# Patient Record
Sex: Male | Born: 1985 | Race: Black or African American | Hispanic: No | Marital: Single | Smoking: Current every day smoker
Health system: Southern US, Community
[De-identification: ages and names within clinical notes are randomized; demographics above are authoritative.]

## PROBLEM LIST (undated history)

## (undated) DIAGNOSIS — Z789 Other specified health status: Secondary | ICD-10-CM

## (undated) HISTORY — DX: Other specified health status: Z78.9

## (undated) HISTORY — PX: APPENDECTOMY: SHX54

## (undated) HISTORY — PX: CIRCUMCISION: SUR203

---

## 2009-12-28 ENCOUNTER — Emergency Department (HOSPITAL_COMMUNITY): Admission: EM | Admit: 2009-12-28 | Discharge: 2009-12-28 | Payer: Self-pay | Admitting: Emergency Medicine

## 2011-07-31 IMAGING — CR DG FINGER LITTLE 2+V*R*
3 series · 3 of 3 positions shown · non-contrast
Comparison: None.

CLINICAL DATA: Laceration of the fifth digit

RIGHT LITTLE FINGER 2+V

[x finger pa right]
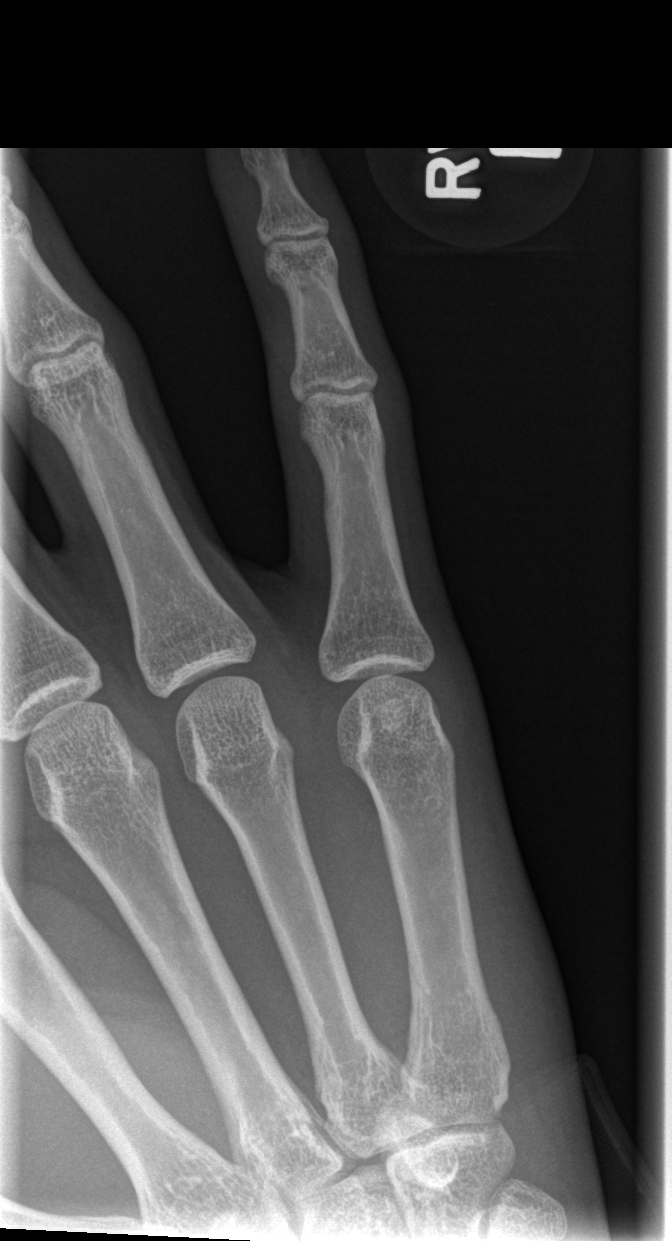

[x finger obl. right]
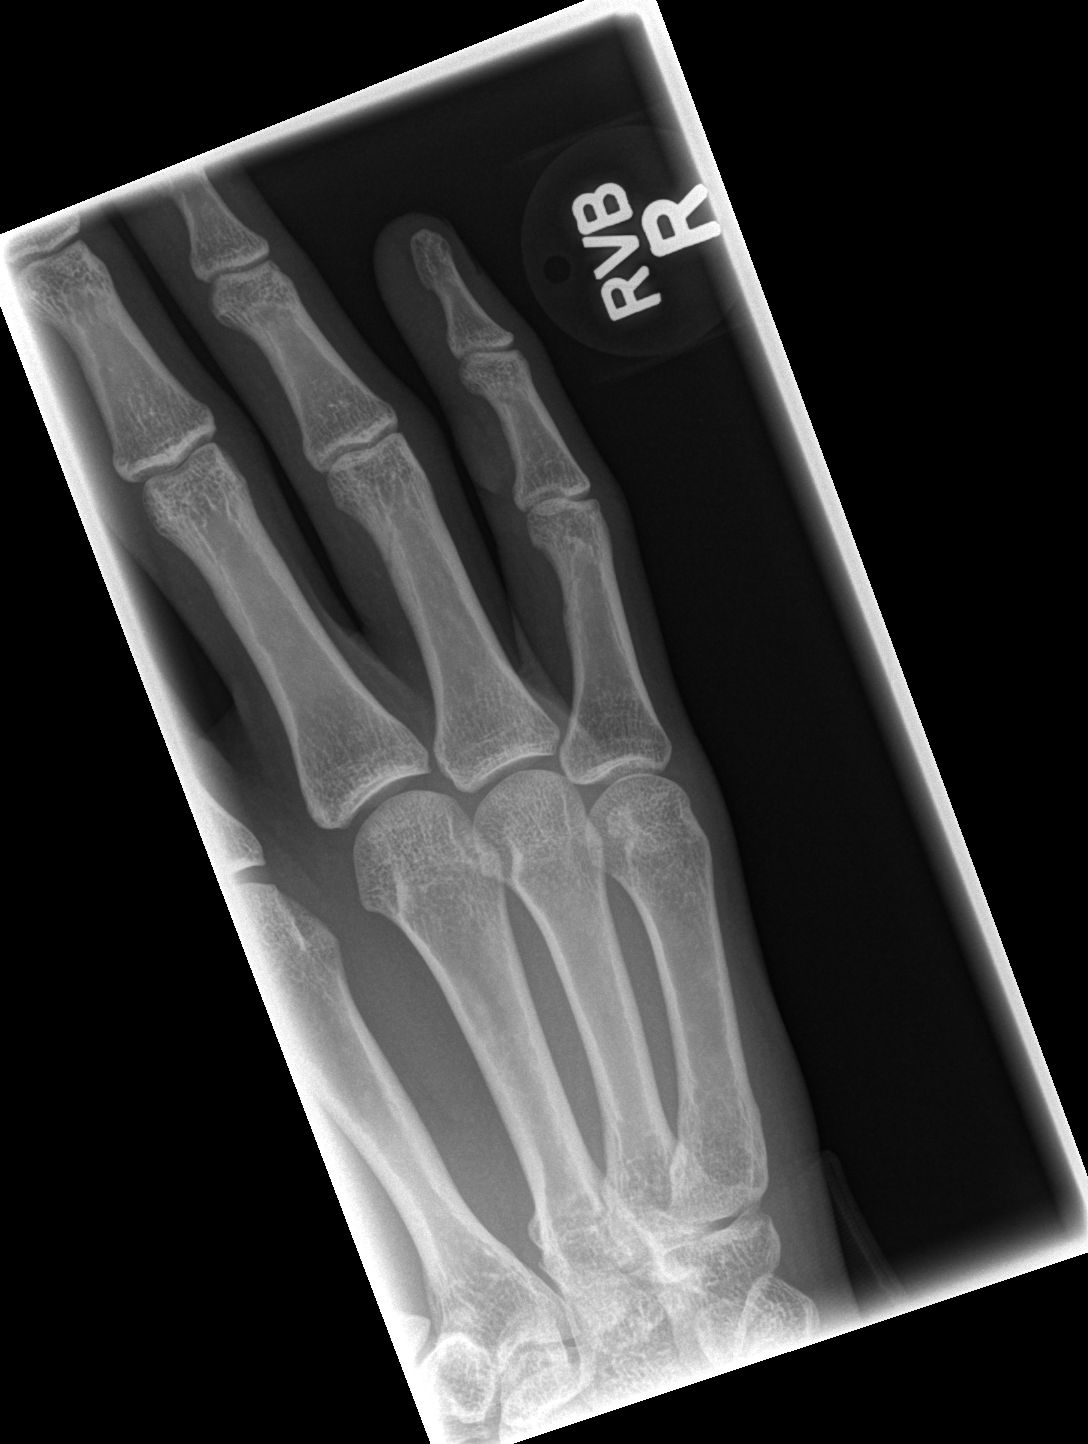

[x finger lateral right]
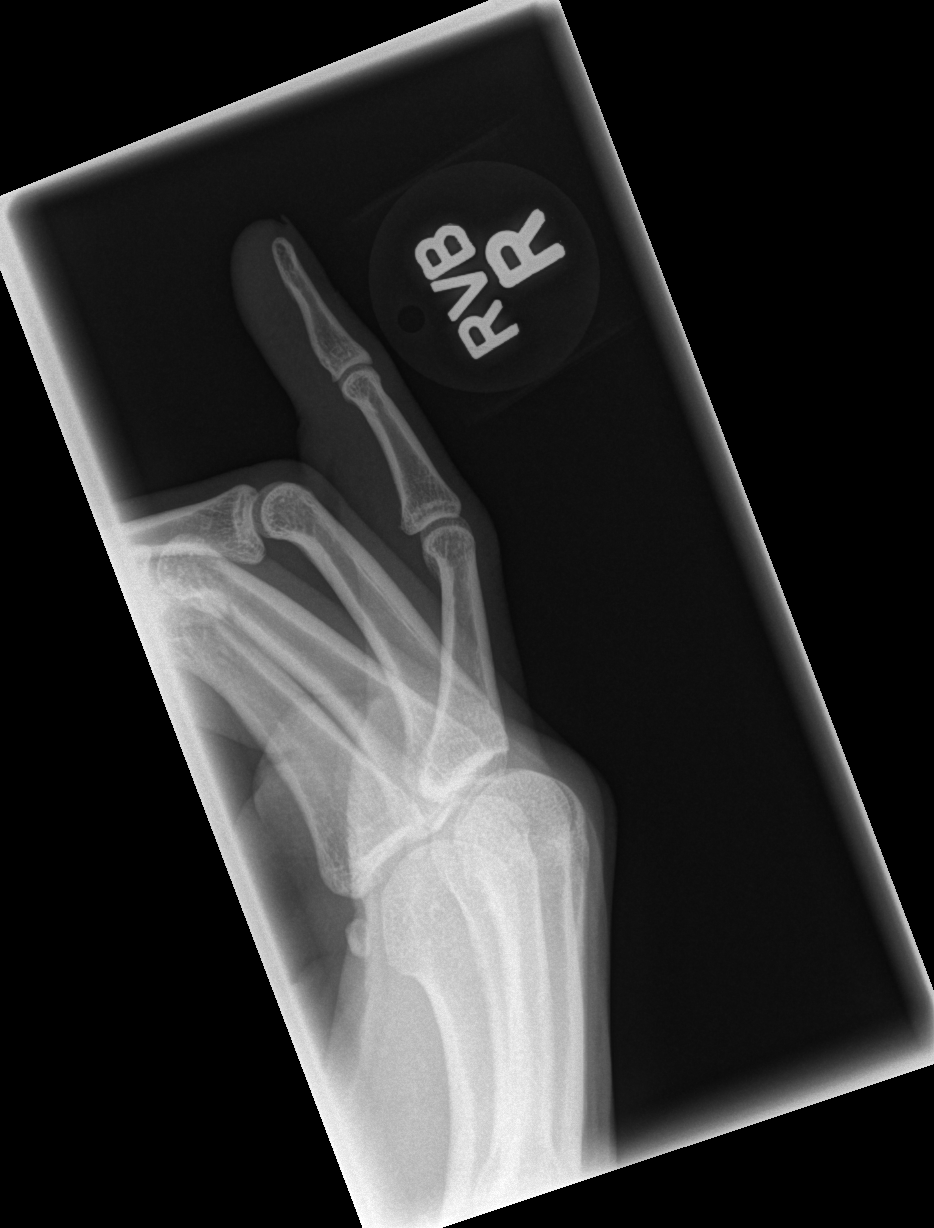

[3 of 3 positions shown; findings below may reference images not displayed]

FINDINGS: No acute fracture is seen.  Alignment is normal.  Joint
spaces appear normal.
IMPRESSION: Negative right fifth digit.

## 2018-05-15 DIAGNOSIS — R1115 Cyclical vomiting syndrome unrelated to migraine: Secondary | ICD-10-CM | POA: Insufficient documentation

## 2022-06-18 ENCOUNTER — Other Ambulatory Visit: Payer: Self-pay

## 2022-06-18 ENCOUNTER — Emergency Department (HOSPITAL_COMMUNITY)
Admission: EM | Admit: 2022-06-18 | Discharge: 2022-06-19 | Disposition: A | Discharge status: Home / Self Care | Payer: Medicaid Other | Attending: Emergency Medicine | Admitting: Emergency Medicine

## 2022-06-18 ENCOUNTER — Encounter (HOSPITAL_COMMUNITY): Payer: Self-pay

## 2022-06-18 DIAGNOSIS — E876 Hypokalemia: Secondary | ICD-10-CM | POA: Insufficient documentation

## 2022-06-18 DIAGNOSIS — R112 Nausea with vomiting, unspecified: Secondary | ICD-10-CM

## 2022-06-18 DIAGNOSIS — R109 Unspecified abdominal pain: Secondary | ICD-10-CM | POA: Insufficient documentation

## 2022-06-18 LAB — CBG MONITORING, ED: Glucose-Capillary: 126 mg/dL — ABNORMAL HIGH (ref 70–99)

## 2022-06-18 NOTE — ED Triage Notes (Signed)
Pt. BIB [facility] for vomiting x8 hrs. 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 Pt. Also states that his prevents him from laying flat. Pt. Is in the bed in a knee to chest position.

## 2022-06-19 LAB — URINALYSIS, ROUTINE W REFLEX MICROSCOPIC
Bacteria, UA: NONE SEEN
Bilirubin Urine: NEGATIVE
Glucose, UA: NEGATIVE mg/dL
Hgb urine dipstick: NEGATIVE
Ketones, ur: 5 mg/dL — AB
Leukocytes,Ua: NEGATIVE
Nitrite: NEGATIVE
Protein, ur: 30 mg/dL — AB
Specific Gravity, Urine: 1.026 (ref 1.005–1.030)
pH: 8 (ref 5.0–8.0)

## 2022-06-19 LAB — COMPREHENSIVE METABOLIC PANEL
ALT: 19 U/L (ref 0–44)
AST: 25 U/L (ref 15–41)
Albumin: 4.2 g/dL (ref 3.5–5.0)
Alkaline Phosphatase: 45 U/L (ref 38–126)
Anion gap: 12 (ref 5–15)
BUN: 10 mg/dL (ref 6–20)
CO2: 20 mmol/L — ABNORMAL LOW (ref 22–32)
Calcium: 9.4 mg/dL (ref 8.9–10.3)
Chloride: 106 mmol/L (ref 98–111)
Creatinine, Ser: 0.99 mg/dL (ref 0.61–1.24)
GFR, Estimated: >60 mL/min (ref >60)
Glucose, Bld: 110 mg/dL — ABNORMAL HIGH (ref 70–99)
Potassium: 3.3 mmol/L — ABNORMAL LOW (ref 3.5–5.1)
Sodium: 138 mmol/L (ref 135–145)
Total Bilirubin: 1.1 mg/dL (ref 0.3–1.2)
Total Protein: 8.2 g/dL — ABNORMAL HIGH (ref 6.5–8.1)

## 2022-06-19 LAB — CBC
HCT: 41.7 % (ref 39.0–52.0)
Hemoglobin: 14.5 g/dL (ref 13.0–17.0)
MCH: 31.6 pg (ref 26.0–34.0)
MCHC: 34.8 g/dL (ref 30.0–36.0)
MCV: 90.8 fL (ref 80.0–100.0)
Platelets: 246 10*3/uL (ref 150–400)
RBC: 4.59 MIL/uL (ref 4.22–5.81)
RDW: 12.3 % (ref 11.5–15.5)
WBC: 5.3 10*3/uL (ref 4.0–10.5)
nRBC: 0 % (ref 0.0–0.2)

## 2022-06-19 LAB — LIPASE, BLOOD: Lipase: 32 U/L (ref 11–51)

## 2022-06-19 MED ORDER — SODIUM CHLORIDE 0.9 % IV SOLN
25.0000 mg | INTRAVENOUS | Status: AC
  Administered 2022-06-19: 25 mg via INTRAVENOUS
  Filled 2022-06-19: qty 25

## 2022-06-19 MED ORDER — LACTATED RINGERS IV BOLUS
1000.0000 mL | Freq: Once | INTRAVENOUS | Status: AC
  Administered 2022-06-19: 1000 mL via INTRAVENOUS

## 2022-06-19 MED ORDER — CAPSAICIN 0.075 % EX CREA
TOPICAL_CREAM | CUTANEOUS | Status: AC
  Filled 2022-06-19: qty 60

## 2022-06-19 MED ORDER — PROMETHAZINE HCL 25 MG PO TABS: 25.0000 mg | ORAL_TABLET | Freq: Four times a day (QID) | ORAL | 0 refills | Status: DC | PRN

## 2022-06-19 MED ORDER — DROPERIDOL 2.5 MG/ML IJ SOLN
1.2500 mg | Freq: Once | INTRAMUSCULAR | Status: AC
  Administered 2022-06-19: 1.25 mg via INTRAVENOUS
  Filled 2022-06-19: qty 2

## 2022-06-19 NOTE — ED Provider Notes (Signed)
[place] EMERGENCY DEPARTMENT AT [facility] Provider Note   CSN: [id number] Arrival date & time: 06/18/22  2310     History  No chief complaint on file.   Reginald Choi is a 37 y.o. male.  HPI  Patient is a 37 year old  Presents emergency room today with complaints of nausea and vomiting.  States that he has been vomiting nearly constantly for the past 8 hours.  States a few hours ago he started having abdominal pain that primarily hurts when he vomits.  Denies any chest pain difficulty breathing.  No lightheadedness or dizziness.  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  He declines to indicate when he last used marijuana.  Denies any recreational drug use other than marijuana.  Denies any alcohol use.  States he usually goes to other hospitals. States he has not had a flareup in "a while "  No bloody or bilious emesis.    Home Medications Prior to Admission medications   Medication Sig Start Date End Date Taking? Authorizing Provider  promethazine (PHENERGAN) 25 MG tablet Take 1 tablet (25 mg total) by mouth every 6 (six) hours as needed for nausea or vomiting. 06/19/22  Yes Tedd Sias, PA      Allergies    Patient has no known allergies.    Review of Systems   Review of Systems  Physical Exam Updated Vital Signs BP 126/72   Pulse (!) 56   Temp 98.6 F (37 C)   Resp 16   SpO2 98%  Physical Exam Vitals and nursing note reviewed.  Constitutional:      General: He is not in acute distress. HENT:     Head: Normocephalic and atraumatic.     Nose: Nose normal.     Mouth/Throat:     Mouth: Mucous membranes are dry.  Eyes:     General: No scleral icterus. Cardiovascular:     Rate and Rhythm: Normal rate and regular rhythm.     Pulses: Normal pulses.     Heart sounds: Normal heart sounds.  Pulmonary:     Effort: Pulmonary effort is normal. No respiratory distress.     Breath sounds: No  wheezing.     Comments: Lungs clear Abdominal:     Palpations: Abdomen is soft.     Tenderness: There is abdominal tenderness.  Musculoskeletal:     Cervical back: Normal range of motion.     Right lower leg: No edema.     Left lower leg: No edema.  Skin:    General: Skin is warm and dry.     Capillary Refill: Capillary refill takes less than 2 seconds.  Neurological:     Mental Status: He is alert. Mental status is at baseline.  Psychiatric:        Mood and Affect: Mood normal.        Behavior: Behavior normal.     ED Results / Procedures / Treatments   Labs (all labs ordered are listed, but only abnormal results are displayed) Labs Reviewed  COMPREHENSIVE METABOLIC PANEL - Abnormal; Notable for the following components:      Result Value   Potassium 3.3 (*)    CO2 20 (*)    Glucose, Bld 110 (*)    Total Protein 8.2 (*)    All other components within normal limits  URINALYSIS, ROUTINE W REFLEX MICROSCOPIC - Abnormal; Notable for the following components:   Ketones, ur 5 (*)  Protein, ur 30 (*)    All other components within normal limits  CBG MONITORING, ED - Abnormal; Notable for the following components:   Glucose-Capillary 126 (*)    All other components within normal limits  LIPASE, BLOOD  CBC    EKG EKG Interpretation  Date/Time:  Saturday June 19 2022 02:47:37 EST Ventricular Rate:  67 PR Interval:  144 QRS Duration: 78 QT Interval:  425 QTC Calculation: 449 R Axis:   80 Text Interpretation: Sinus rhythm Confirmed by Quintella Reichert [phone]) on 06/19/2022 4:27:30 AM  Radiology No results found.  Procedures Procedures    Medications Ordered in ED Medications  lactated ringers bolus 1,000 mL (0 mLs Intravenous Stopped 06/19/22 0211)  promethazine (PHENERGAN) 25 mg in sodium chloride 0.9 % 50 mL IVPB (0 mg Intravenous Stopped 06/19/22 0055)  capsicum (ZOSTRIX) 0.075 % cream ( Topical Given 06/19/22 0042)  lactated ringers bolus 1,000 mL (0 mLs  Intravenous Stopped 06/19/22 0415)  droperidol (INAPSINE) 2.5 MG/ML injection 1.25 mg (1.25 mg Intravenous Given 06/19/22 0412)    ED Course/ Medical Decision Making/ A&P Clinical Course as of 06/19/22 2220  Sat Jun 19, 2022  0015 Yesterday evening and today NV no change in stool. MJ smokes regularly. No blood or bile in emesis.  Abd pain began after vomiting. No urinary sx.  No CP or SOB.  [WF]    Clinical Course User Index [WF] Tedd Sias, [place]                             Medical Decision Making Amount and/or Complexity of Data Reviewed Labs: ordered.  Risk OTC drugs. Prescription drug management.   This patient presents to the ED for concern of nausea vomiting abdominal pain, this involves a number of treatment options, and is a complaint that carries with it a moderate to high risk of complications and morbidity. A differential diagnosis was considered for the patient's symptoms which is discussed below:   The emergent differential diagnosis for vomiting includes, but is not limited to ACS/MI, Boerhaave's, DKA, Intracranial Hemorrhage, Ischemic bowel, Meningitis, Sepsis, Acute gastric dilation, Acetaminophen toxicity, Adrenal insufficiency, Appendicitis, Aspirin toxicity, Bowel obstruction/ileus, Cholecystitis, CNS tumor. Electrolyte abnormalities, Elevated ICP, Gastric outlet obstruction, Hyperemesis gravidarum, Pancreatitis, Peritonitis, Ruptured viscus, Testicular torsion/ovarian torsion, Biliary colic, Cannabinoid hyperemesis syndrome, Disulfiram effect, ETOH, Gastritis, Gastroenteritis, Gastroparesis, Hepatitis, Ibuprofen, Labyrinthitis, Migraine, Motion sickness, Narcotic withdrawal, Thyroid, Pregnancy, Peptic ulcer disease, Renal colic, and UTI    Co morbidities: Discussed in HPI   Brief History:  Patient is a 37 year old  Presents emergency room today with complaints of nausea and vomiting.  States that he has been vomiting nearly constantly for the past 8 hours.   States a few hours ago he started having abdominal pain that primarily hurts when he vomits.  Denies any chest pain difficulty breathing.  No lightheadedness or dizziness.  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  He declines to indicate when he last used marijuana.  Denies any recreational drug use other than marijuana.  Denies any alcohol use.  States he usually goes to other hospitals. States he has not had a flareup in "a while "  No bloody or bilious emesis.     EMR reviewed including pt PMHx, past surgical history and past visits to ER.   See HPI for more details   Lab Tests:   I personally reviewed all laboratory work  and imaging. Metabolic panel without any acute abnormality specifically kidney function within normal limits and no significant electrolyte abnormalities. CBC without leukocytosis or significant anemia. Of note there is mild hypokalemia 3.3 this was repleted by hydration with LR.  Will need to have this rechecked. Some starvation ketosis likely present due to ketones in urine.  CBG normal. Does not appear to be in DKA.  Bicarb mildly low at 20 likely due to severe dehydration.  Imaging Studies:  No imaging studies ordered for this patient    Cardiac Monitoring:  The patient was maintained on a cardiac monitor.  I personally viewed and interpreted the cardiac monitored which showed an underlying rhythm of: NSR EKG non-ischemic   Medicines ordered:  I ordered medication including lactated Ringer's, droperidol, second liter of LR, Phenergan, topical capsaicin cream to abdomen for hyperemesis Reevaluation of the patient after these medicines showed that the patient resolved I have reviewed the patients home medicines and have made adjustments as needed   Critical Interventions:     Consults/Attending Physician      Reevaluation:  After the interventions noted above I re-evaluated patient and  found that they have :resolved   Social Determinants of Health:      Problem List / ED Course:  Abdominal pain nausea and vomiting.  Patient is vomiting seem to predate the abdominal pain and he has been forcefully vomiting for several hours now by his estimation approximately 8 hours.  On initial exam patient is somewhat mildly diffusely tender to palpation to the abdomen no focal abdominal tenderness.  Repeat examination after medication hydration and cessation of frequent and forceful emesis patient is no longer tender in the abdomen or experiencing any abdominal pain.  I expect the etiology of his abdominal pain is likely muscular. Patient is tolerating p.o., ambulatory, feels much improved.  Has been aggressively hydrated here.  Will discharge home with follow-up with gastroenterology and with antiemetics.   Dispostion:  After consideration of the diagnostic results and the patients response to treatment, I feel that the patent would benefit from close outpatient follow-up.  Final Clinical Impression(s) / ED Diagnoses Final diagnoses:  Nausea and vomiting, unspecified vomiting type    Rx / DC Orders ED Discharge Orders          Ordered    promethazine (PHENERGAN) 25 MG tablet  Every 6 hours PRN        06/19/22 0754              Tedd Sias, PA 06/19/22 2220    Quintella Reichert, MD 06/19/22 2238

## 2022-06-19 NOTE — Discharge Instructions (Addendum)
Hydrate, avoid fatty or spicy foods.  Take Phenergan as prescribed as needed for nausea or vomiting.

## 2022-06-19 NOTE — ED Notes (Signed)
Pt. Given ginger ale for fluid challenge

## 2022-06-25 ENCOUNTER — Emergency Department (HOSPITAL_COMMUNITY)
Admission: EM | Admit: 2022-06-25 | Discharge: 2022-06-25 | Disposition: A | Discharge status: Home / Self Care | Payer: Medicaid Other | Attending: Emergency Medicine | Admitting: Emergency Medicine

## 2022-06-25 ENCOUNTER — Other Ambulatory Visit: Payer: Self-pay

## 2022-06-25 ENCOUNTER — Encounter (HOSPITAL_COMMUNITY): Payer: Self-pay

## 2022-06-25 DIAGNOSIS — R1084 Generalized abdominal pain: Secondary | ICD-10-CM | POA: Diagnosis not present

## 2022-06-25 DIAGNOSIS — R112 Nausea with vomiting, unspecified: Secondary | ICD-10-CM | POA: Diagnosis present

## 2022-06-25 LAB — COMPREHENSIVE METABOLIC PANEL
ALT: 17 U/L (ref 0–44)
AST: 28 U/L (ref 15–41)
Albumin: 5 g/dL (ref 3.5–5.0)
Alkaline Phosphatase: 49 U/L (ref 38–126)
Anion gap: 10 (ref 5–15)
BUN: 8 mg/dL (ref 6–20)
CO2: 23 mmol/L (ref 22–32)
Calcium: 9.3 mg/dL (ref 8.9–10.3)
Chloride: 105 mmol/L (ref 98–111)
Creatinine, Ser: 1 mg/dL (ref 0.61–1.24)
GFR, Estimated: >60 mL/min (ref >60)
Glucose, Bld: 113 mg/dL — ABNORMAL HIGH (ref 70–99)
Potassium: 3.4 mmol/L — ABNORMAL LOW (ref 3.5–5.1)
Sodium: 138 mmol/L (ref 135–145)
Total Bilirubin: 0.7 mg/dL (ref 0.3–1.2)
Total Protein: 8.3 g/dL — ABNORMAL HIGH (ref 6.5–8.1)

## 2022-06-25 LAB — CBC WITH DIFFERENTIAL/PLATELET
Abs Immature Granulocytes: 0.01 10*3/uL (ref 0.00–0.07)
Basophils Absolute: 0.1 10*3/uL (ref 0.0–0.1)
Basophils Relative: 1 %
Eosinophils Absolute: 0 10*3/uL (ref 0.0–0.5)
Eosinophils Relative: 0 %
HCT: 44.4 % (ref 39.0–52.0)
Hemoglobin: 15.3 g/dL (ref 13.0–17.0)
Immature Granulocytes: 0 %
Lymphocytes Relative: 15 %
Lymphs Abs: 1 10*3/uL (ref 0.7–4.0)
MCH: 31.7 pg (ref 26.0–34.0)
MCHC: 34.5 g/dL (ref 30.0–36.0)
MCV: 91.9 fL (ref 80.0–100.0)
Monocytes Absolute: 0.3 10*3/uL (ref 0.1–1.0)
Monocytes Relative: 5 %
Neutro Abs: 4.9 10*3/uL (ref 1.7–7.7)
Neutrophils Relative %: 79 %
Platelets: 250 10*3/uL (ref 150–400)
RBC: 4.83 MIL/uL (ref 4.22–5.81)
RDW: 12.6 % (ref 11.5–15.5)
WBC: 6.3 10*3/uL (ref 4.0–10.5)
nRBC: 0 % (ref 0.0–0.2)

## 2022-06-25 LAB — LIPASE, BLOOD: Lipase: 30 U/L (ref 11–51)

## 2022-06-25 MED ORDER — ONDANSETRON HCL 4 MG PO TABS: 4.0000 mg | ORAL_TABLET | Freq: Three times a day (TID) | ORAL | 0 refills | Status: DC | PRN

## 2022-06-25 MED ORDER — DROPERIDOL 2.5 MG/ML IJ SOLN
1.2500 mg | Freq: Once | INTRAMUSCULAR | Status: AC
  Administered 2022-06-25: 1.25 mg via INTRAVENOUS
  Filled 2022-06-25: qty 2

## 2022-06-25 MED ORDER — LACTATED RINGERS IV BOLUS
1000.0000 mL | Freq: Once | INTRAVENOUS | Status: AC
  Administered 2022-06-25: 1000 mL via INTRAVENOUS

## 2022-06-25 MED ORDER — SODIUM CHLORIDE 0.9 % IV SOLN
12.5000 mg | Freq: Four times a day (QID) | INTRAVENOUS | Status: DC | PRN
  Administered 2022-06-25: 12.5 mg via INTRAVENOUS
  Filled 2022-06-25: qty 12.5

## 2022-06-25 NOTE — ED Provider Triage Note (Signed)
Emergency Medicine Provider Triage Evaluation Note  Reginald Choi , a 37 y.o. male  was evaluated in triage.  Pt complains of nausea and vomiting for the past few days.  Associated with abdominal cramping.  States she has cyclic vomiting syndrome and this feels the same..  Review of Systems  Per HPI  Physical Exam  There were no vitals taken for this visit. Gen:   Awake, no distress   Resp:  Normal effort  MSK:   Moves extremities without difficulty  Other:  Moaning and writhing loudly  Medical Decision Making  Medically screening exam initiated at 12:25 PM.  Appropriate orders placed.  Reginald Choi was informed that the remainder of the evaluation will be completed by another provider, this initial triage assessment does not replace that evaluation, and the importance of remaining in the ED until their evaluation is complete.     Reginald Raring, PA-C 06/25/22 1225

## 2022-06-25 NOTE — Discharge Instructions (Addendum)
You are seen today in the emergency department due to nausea and vomiting.  Drink plenty of fluids, take Zofran as needed every 8 hours.  Return to the ED for new or concerning symptoms.

## 2022-06-25 NOTE — ED Notes (Signed)
Patient refused to get undress and into a gown

## 2022-06-25 NOTE — ED Triage Notes (Signed)
Pt BIBA from home. Pt c/o N/V. Pt has hx cyclical vomiting.   Pt Aox4  BP: 138/84 HR: 60 SPO2: 100 RA CBG: 116

## 2022-06-25 NOTE — ED Provider Notes (Signed)
[place] Provider Note   CSN: [id number] Arrival date & time: 06/25/22  1214     History  Chief Complaint  Patient presents with   Nausea    abd   Abdominal Pain    Reginald Choi is a 37 y.o. male.   Abdominal Pain    Patient presents due to nausea, vomiting, generalized abdominal pain x 2 to 3 days.  Patient states he has a history of cyclic vomiting syndrome and this feels similar.  No diarrhea, recent travel, chest pain, shortness of breath.   Home Medications Prior to Admission medications   Medication Sig Start Date End Date Taking? Authorizing Provider  ondansetron (ZOFRAN) 4 MG tablet Take 1 tablet (4 mg total) by mouth every 8 (eight) hours as needed for nausea or vomiting. 06/25/22  Yes Sherrill Raring, PA-C  promethazine (PHENERGAN) 25 MG tablet Take 1 tablet (25 mg total) by mouth every 6 (six) hours as needed for nausea or vomiting. 06/19/22   Tedd Sias, PA      Allergies    Patient has no known allergies.    Review of Systems   Review of Systems  Gastrointestinal:  Positive for abdominal pain.    Physical Exam Updated Vital Signs BP (!) 145/95   Pulse 62   Temp 98.4 F (36.9 C) (Oral)   Resp 14   Ht '6\' 2"'$  (1.88 m)   Wt 74.8 kg   SpO2 100%   BMI 21.18 kg/m  Physical Exam Vitals and nursing note reviewed. Exam conducted with a chaperone present.  Constitutional:      Appearance: Normal appearance.     Comments: Patient is writhing around in the bed and screaming "I need phenergan"   HENT:     Head: Normocephalic and atraumatic.  Eyes:     General: No scleral icterus.       Right eye: No discharge.        Left eye: No discharge.     Extraocular Movements: Extraocular movements intact.     Pupils: Pupils are equal, round, and reactive to light.  Cardiovascular:     Rate and Rhythm: Normal rate and regular rhythm.     Pulses: Normal pulses.     Heart sounds: Normal heart sounds. No murmur  heard.    No friction rub. No gallop.  Pulmonary:     Effort: Pulmonary effort is normal. No respiratory distress.     Breath sounds: Normal breath sounds.  Abdominal:     General: Abdomen is flat. Bowel sounds are normal. There is no distension.     Palpations: Abdomen is soft.     Tenderness: There is generalized abdominal tenderness.  Skin:    General: Skin is warm and dry.     Coloration: Skin is not jaundiced.  Neurological:     Mental Status: He is alert. Mental status is at baseline.     Coordination: Coordination normal.     ED Results / Procedures / Treatments   Labs (all labs ordered are listed, but only abnormal results are displayed) Labs Reviewed  COMPREHENSIVE METABOLIC PANEL - Abnormal; Notable for the following components:      Result Value   Potassium 3.4 (*)    Glucose, Bld 113 (*)    Total Protein 8.3 (*)    All other components within normal limits  CBC WITH DIFFERENTIAL/PLATELET  LIPASE, BLOOD    EKG None  Radiology No results found.  Procedures  Procedures    Medications Ordered in ED Medications  promethazine (PHENERGAN) 12.5 mg in sodium chloride 0.9 % 50 mL IVPB (12.5 mg Intravenous New Bag/Given 06/25/22 1453)  droperidol (INAPSINE) 2.5 MG/ML injection 1.25 mg (1.25 mg Intravenous Given 06/25/22 1336)  lactated ringers bolus 1,000 mL (0 mLs Intravenous Stopped 06/25/22 1451)  lactated ringers bolus 1,000 mL (1,000 mLs Intravenous New Bag/Given 06/25/22 1450)    ED Course/ Medical Decision Making/ A&P Clinical Course as of 06/25/22 1603  Fri Jun 25, 2022  1428 I reassessed patient is resting comfortably, still feels mildly nauseated waiting on Phenergan.  [HS]    Clinical Course User Index [HS] Sherrill Raring, PA-C                             Medical Decision Making Amount and/or Complexity of Data Reviewed Labs: ordered.  Risk Prescription drug management.   This is a 37 year old male with history of cyclic vomiting syndrome presenting  to the emergency department due to nausea, vomiting and generalized abdominal pain.  Differential includes but not limited to cyclic vomiting syndrome, dehydration, AKI, electrolyte derangement, DKA, acute intra-abdominal process, gastroparesis.  Reviewed external medical records, patient does not have any other documented comorbidities or documented history of previous abdominal surgeries.  Will start with laboratory workup, fluid, EKG to check QTc intervals and droperidol and Phenergan.  Patient has generalized abdominal tenderness but there are no peritoneal signs on exam.  I recommended interpreted laboratory workup.  CBC with leukocytosis or anemia.  CMP without gross electrode derangement, AKI or transaminitis.  Lipase within normal limits, not consistent with pancreatitis.  Patient's vitals have remained stable, no tachycardia, no fever and no SIRS criteria met, not consistent with sepsis or SIRS. I reeval the patient multiple times, tolerating p.o. significantly improved after Phenergan and droperidol.  I discussed THC cessation, close outpatient follow-up and PCP referral was provided.  Stable for discharge at this time.        Final Clinical Impression(s) / ED Diagnoses Final diagnoses:  Nausea and vomiting, unspecified vomiting type    Rx / DC Orders ED Discharge Orders          Ordered    ondansetron (ZOFRAN) 4 MG tablet  Every 8 hours PRN        06/25/22 1557              Sherrill Raring, PA-C 06/25/22 1603    Gareth Morgan, MD 06/25/22 2333

## 2022-12-09 ENCOUNTER — Other Ambulatory Visit: Payer: Self-pay

## 2022-12-09 ENCOUNTER — Encounter (HOSPITAL_BASED_OUTPATIENT_CLINIC_OR_DEPARTMENT_OTHER): Payer: Self-pay

## 2022-12-09 ENCOUNTER — Emergency Department (HOSPITAL_BASED_OUTPATIENT_CLINIC_OR_DEPARTMENT_OTHER)
Admission: EM | Admit: 2022-12-09 | Discharge: 2022-12-09 | Disposition: A | Discharge status: Home / Self Care | Payer: Medicaid Other | Attending: Emergency Medicine | Admitting: Emergency Medicine

## 2022-12-09 DIAGNOSIS — R109 Unspecified abdominal pain: Secondary | ICD-10-CM

## 2022-12-09 DIAGNOSIS — R1084 Generalized abdominal pain: Secondary | ICD-10-CM | POA: Insufficient documentation

## 2022-12-09 DIAGNOSIS — R112 Nausea with vomiting, unspecified: Secondary | ICD-10-CM | POA: Diagnosis present

## 2022-12-09 LAB — CBC WITH DIFFERENTIAL/PLATELET
Abs Immature Granulocytes: 0 10*3/uL (ref 0.00–0.07)
Basophils Absolute: 0 10*3/uL (ref 0.0–0.1)
Basophils Relative: 0 %
Eosinophils Absolute: 0 10*3/uL (ref 0.0–0.5)
Eosinophils Relative: 0 %
HCT: 42.9 % (ref 39.0–52.0)
Hemoglobin: 14.9 g/dL (ref 13.0–17.0)
Lymphocytes Relative: 11 %
Lymphs Abs: 0.7 10*3/uL (ref 0.7–4.0)
MCH: 31.6 pg (ref 26.0–34.0)
MCHC: 34.7 g/dL (ref 30.0–36.0)
MCV: 91.1 fL (ref 80.0–100.0)
Monocytes Absolute: 0.5 10*3/uL (ref 0.1–1.0)
Monocytes Relative: 7 %
Neutro Abs: 5.6 10*3/uL (ref 1.7–7.7)
Neutrophils Relative %: 82 %
Platelets: 207 10*3/uL (ref 150–400)
RBC Morphology: NONE SEEN
RBC: 4.71 MIL/uL (ref 4.22–5.81)
RDW: 12.5 % (ref 11.5–15.5)
WBC: 6.8 10*3/uL (ref 4.0–10.5)
nRBC: 0 % (ref 0.0–0.2)

## 2022-12-09 LAB — COMPREHENSIVE METABOLIC PANEL
ALT: 24 U/L (ref 0–44)
AST: 26 U/L (ref 15–41)
Albumin: 4.8 g/dL (ref 3.5–5.0)
Alkaline Phosphatase: 40 U/L (ref 38–126)
Anion gap: 10 (ref 5–15)
BUN: 13 mg/dL (ref 6–20)
CO2: 25 mmol/L (ref 22–32)
Calcium: 10.2 mg/dL (ref 8.9–10.3)
Chloride: 105 mmol/L (ref 98–111)
Creatinine, Ser: 1 mg/dL (ref 0.61–1.24)
GFR, Estimated: >60 mL/min (ref >60)
Glucose, Bld: 115 mg/dL — ABNORMAL HIGH (ref 70–99)
Potassium: 3.7 mmol/L (ref 3.5–5.1)
Sodium: 140 mmol/L (ref 135–145)
Total Bilirubin: 0.5 mg/dL (ref 0.3–1.2)
Total Protein: 7.7 g/dL (ref 6.5–8.1)

## 2022-12-09 MED ORDER — SODIUM CHLORIDE 0.9 % IV BOLUS
1000.0000 mL | Freq: Once | INTRAVENOUS | Status: AC
  Administered 2022-12-09: 1000 mL via INTRAVENOUS

## 2022-12-09 MED ORDER — ONDANSETRON HCL 4 MG/2ML IJ SOLN
4.0000 mg | Freq: Once | INTRAMUSCULAR | Status: AC
  Administered 2022-12-09: 4 mg via INTRAVENOUS
  Filled 2022-12-09: qty 2

## 2022-12-09 MED ORDER — HALOPERIDOL LACTATE 5 MG/ML IJ SOLN
5.0000 mg | Freq: Once | INTRAMUSCULAR | Status: AC
  Administered 2022-12-09: 5 mg via INTRAVENOUS
  Filled 2022-12-09: qty 1

## 2022-12-09 NOTE — Discharge Instructions (Signed)
Please drink plenty of fluids and get plenty of rest.  I would stick with a brat diet to include bananas, rice, applesauce, and toast for the next few days and slowly incorporate your normal diet as tolerated.  You can follow-up with your primary care doctor behind one.  Otherwise you may return to the emergency department for any worsening symptoms.

## 2022-12-09 NOTE — ED Triage Notes (Signed)
Pt c/o HCS exacerbation, states last yesterday was last time smoking, vomiting since this AM. Associated abd pain L side.

## 2022-12-09 NOTE — ED Provider Notes (Signed)
[place] [facility] EMERGENCY DEPARTMENT AT [facility] Provider Note   CSN: [id number] Arrival date & time: 12/09/22  1258     History Chief Complaint  Patient presents with   Emesis   Abdominal Pain    Reginald Choi is a 37 y.o. male with history of cyclic vomiting syndrome who presents to the emergency department today for further evaluation of nausea and vomiting since yesterday.  Patient does use marijuana on a regular basis in the last consumed yesterday morning.  He reports associated abdominal cramping and pain.  Patient adamant that this is not related to marijuana and is requesting pain medication.   Emesis Associated symptoms: abdominal pain   Abdominal Pain Associated symptoms: vomiting        Home Medications Prior to Admission medications   Medication Sig Start Date End Date Taking? Authorizing Provider  ondansetron (ZOFRAN) 4 MG tablet Take 1 tablet (4 mg total) by mouth every 8 (eight) hours as needed for nausea or vomiting. 06/25/22   Theron Arista, PA-C  promethazine (PHENERGAN) 25 MG tablet Take 1 tablet (25 mg total) by mouth every 6 (six) hours as needed for nausea or vomiting. 06/19/22   Gailen Shelter, PA      Allergies    Patient has no known allergies.    Review of Systems   Review of Systems  Gastrointestinal:  Positive for abdominal pain and vomiting.  All other systems reviewed and are negative.   Physical Exam Updated Vital Signs BP (!) 151/98   Pulse (!) 58   Temp 97.9 F (36.6 C)   Resp 16   SpO2 100%  Physical Exam Vitals and nursing note reviewed.  Constitutional:      General: He is not in acute distress.    Appearance: Normal appearance.  HENT:     Head: Normocephalic and atraumatic.  Eyes:     General:        Right eye: No discharge.        Left eye: No discharge.  Cardiovascular:     Comments: Regular rate and rhythm.  S1/S2 are distinct without any evidence of murmur, rubs, or gallops.  Radial pulses are 2+ bilaterally.   Dorsalis pedis pulses are 2+ bilaterally.  No evidence of pedal edema. Pulmonary:     Comments: Clear to auscultation bilaterally.  Normal effort.  No respiratory distress.  No evidence of wheezes, rales, or rhonchi heard throughout. Abdominal:     General: Abdomen is flat. Bowel sounds are normal. There is no distension.     Tenderness: There is generalized abdominal tenderness. There is no guarding or rebound.  Musculoskeletal:        General: Normal range of motion.     Cervical back: Neck supple.  Skin:    General: Skin is warm and dry.     Findings: No rash.  Neurological:     General: No focal deficit present.     Mental Status: He is alert.  Psychiatric:        Mood and Affect: Mood normal.        Behavior: Behavior normal.     ED Results / Procedures / Treatments   Labs (all labs ordered are listed, but only abnormal results are displayed) Labs Reviewed  COMPREHENSIVE METABOLIC PANEL - Abnormal; Notable for the following components:      Result Value   Glucose, Bld 115 (*)    All other components within normal limits  CBC WITH DIFFERENTIAL/PLATELET    EKG  None  Radiology No results found.  Procedures Procedures    Medications Ordered in ED Medications  sodium chloride 0.9 % bolus 1,000 mL (0 mLs Intravenous Stopped 12/09/22 1529)  ondansetron (ZOFRAN) injection 4 mg (4 mg Intravenous Given 12/09/22 1421)  haloperidol lactate (HALDOL) injection 5 mg (5 mg Intravenous Given 12/09/22 1421)    ED Course/ Medical Decision Making/ A&P Clinical Course as of 12/09/22 1550  Thu Dec 09, 2022  1427 CBC with Differential Normal.  [CF]  1427 Comprehensive metabolic panel(!) Normal.  [CF]  1541 Patient feeling much better.  He is resting comfortably in the emerged permit.  Tolerating p.o.  He is safer discharge. [CF]    Clinical Course User Index [CF] Teressa Lower, PA-C   {   Click here for ABCD2, HEART and other calculators  Medical Decision  Making Reginald Choi is a 37 y.o. male patient who presents to the emergency department today for further evaluation of nausea and vomiting in the setting of cyclic vomiting syndrome.  Patient has been seen here in the emergency department numerous times for similar symptoms.  He states that his symptoms here are similar to previous.  Patient adamant that this is not related to marijuana.  I will start off with Haldol, basic labs, fluids, Zofran.  Patient feeling much better after Haldol and fluids and Zofran.  He is resting comfortably.  He tolerated p.o.  He is safe for discharge.  Strict turn precautions were discussed.  All questions or concerns addressed.  Amount and/or Complexity of Data Reviewed Labs: ordered. Decision-making details documented in ED Course.  Risk Prescription drug management.    Final Clinical Impression(s) / ED Diagnoses Final diagnoses:  Nausea and vomiting, unspecified vomiting type  Abdominal cramping    Rx / DC Orders ED Discharge Orders     None         Teressa Lower, PA-C 12/09/22 1550    Virgina Norfolk, DO 12/10/22 [phone]

## 2022-12-11 ENCOUNTER — Encounter (HOSPITAL_COMMUNITY): Payer: Self-pay

## 2022-12-11 ENCOUNTER — Emergency Department (HOSPITAL_COMMUNITY): Payer: Medicaid Other

## 2022-12-11 ENCOUNTER — Emergency Department (HOSPITAL_COMMUNITY)
Admission: EM | Admit: 2022-12-11 | Discharge: 2022-12-11 | Disposition: A | Discharge status: Home / Self Care | Payer: Medicaid Other | Source: Home / Self Care | Attending: Emergency Medicine | Admitting: Emergency Medicine

## 2022-12-11 ENCOUNTER — Other Ambulatory Visit: Payer: Self-pay

## 2022-12-11 DIAGNOSIS — R109 Unspecified abdominal pain: Secondary | ICD-10-CM

## 2022-12-11 DIAGNOSIS — R112 Nausea with vomiting, unspecified: Secondary | ICD-10-CM | POA: Diagnosis present

## 2022-12-11 LAB — LIPASE, BLOOD: Lipase: 25 U/L (ref 11–51)

## 2022-12-11 LAB — COMPREHENSIVE METABOLIC PANEL
ALT: 41 U/L (ref 0–44)
AST: 41 U/L (ref 15–41)
Albumin: 4.1 g/dL (ref 3.5–5.0)
Alkaline Phosphatase: 41 U/L (ref 38–126)
Anion gap: 11 (ref 5–15)
BUN: 10 mg/dL (ref 6–20)
CO2: 22 mmol/L (ref 22–32)
Calcium: 8.8 mg/dL — ABNORMAL LOW (ref 8.9–10.3)
Chloride: 103 mmol/L (ref 98–111)
Creatinine, Ser: 1.17 mg/dL (ref 0.61–1.24)
GFR, Estimated: >60 mL/min (ref >60)
Glucose, Bld: 114 mg/dL — ABNORMAL HIGH (ref 70–99)
Potassium: 3.6 mmol/L (ref 3.5–5.1)
Sodium: 136 mmol/L (ref 135–145)
Total Bilirubin: 0.9 mg/dL (ref 0.3–1.2)
Total Protein: 7.4 g/dL (ref 6.5–8.1)

## 2022-12-11 LAB — CBC WITH DIFFERENTIAL/PLATELET
Abs Immature Granulocytes: 0.03 10*3/uL (ref 0.00–0.07)
Basophils Absolute: 0 10*3/uL (ref 0.0–0.1)
Basophils Relative: 1 %
Eosinophils Absolute: 0 10*3/uL (ref 0.0–0.5)
Eosinophils Relative: 0 %
HCT: 42.8 % (ref 39.0–52.0)
Hemoglobin: 14.7 g/dL (ref 13.0–17.0)
Immature Granulocytes: 1 %
Lymphocytes Relative: 15 %
Lymphs Abs: 1 10*3/uL (ref 0.7–4.0)
MCH: 31.3 pg (ref 26.0–34.0)
MCHC: 34.3 g/dL (ref 30.0–36.0)
MCV: 91.3 fL (ref 80.0–100.0)
Monocytes Absolute: 0.3 10*3/uL (ref 0.1–1.0)
Monocytes Relative: 5 %
Neutro Abs: 5.1 10*3/uL (ref 1.7–7.7)
Neutrophils Relative %: 78 %
Platelets: 213 10*3/uL (ref 150–400)
RBC: 4.69 MIL/uL (ref 4.22–5.81)
RDW: 12.8 % (ref 11.5–15.5)
WBC: 6.5 10*3/uL (ref 4.0–10.5)
nRBC: 0 % (ref 0.0–0.2)

## 2022-12-11 MED ORDER — PROMETHAZINE HCL 25 MG PO TABS: 25.0000 mg | ORAL_TABLET | Freq: Four times a day (QID) | ORAL | 0 refills | Status: DC | PRN

## 2022-12-11 MED ORDER — HALOPERIDOL LACTATE 5 MG/ML IJ SOLN
5.0000 mg | Freq: Once | INTRAMUSCULAR | Status: AC
  Administered 2022-12-11: 5 mg via INTRAVENOUS
  Filled 2022-12-11: qty 1

## 2022-12-11 MED ORDER — KETOROLAC TROMETHAMINE 30 MG/ML IJ SOLN
15.0000 mg | Freq: Once | INTRAMUSCULAR | Status: AC
  Administered 2022-12-11: 15 mg via INTRAVENOUS
  Filled 2022-12-11: qty 1

## 2022-12-11 MED ORDER — IOHEXOL 350 MG/ML SOLN
75.0000 mL | Freq: Once | INTRAVENOUS | Status: AC | PRN
  Administered 2022-12-11: 75 mL via INTRAVENOUS

## 2022-12-11 MED ORDER — SODIUM CHLORIDE 0.9 % IV BOLUS
1000.0000 mL | Freq: Once | INTRAVENOUS | Status: AC
  Administered 2022-12-11: 1000 mL via INTRAVENOUS

## 2022-12-11 MED ORDER — ONDANSETRON HCL 4 MG/2ML IJ SOLN
4.0000 mg | Freq: Once | INTRAMUSCULAR | Status: AC
  Administered 2022-12-11: 4 mg via INTRAVENOUS
  Filled 2022-12-11: qty 2

## 2022-12-11 NOTE — ED Triage Notes (Signed)
Pt reports generalized abd pain and emesis for the past 3 days. Hx of cyclic vomiting syndrome. States he was seen for the same on 8/22

## 2022-12-11 NOTE — ED Provider Notes (Signed)
[place] EMERGENCY DEPARTMENT AT [facility] Provider Note   CSN: [id number] Arrival date & time: 12/11/22  9147     History {Add pertinent medical, surgical, social history, OB history to HPI:1} Chief Complaint  Patient presents with   Abdominal Pain   Emesis   HPI Reginald Choi is a 37 y.o. male presenting for abdominal pain and emesis.  Has been going for 3 days.  Abdominal pain is in the upper abdomen.  It feels like a sharp pain is nonradiating.  Emesis is nonbloody and nonbilious.  Reports some normal bowel movement this morning.  States he was seen for the symptoms 2 days ago.  States he initially felt better after discharge but symptoms returned.  Reports use of marijuana.  States that he has not used marijuana or alcohol in the last 3 days.  Also reports history of cyclic vomiting syndrome.  Denies fever.  Denies chest pain shortness of breath.   Abdominal Pain Associated symptoms: vomiting   Emesis Associated symptoms: abdominal pain        Home Medications Prior to Admission medications   Medication Sig Start Date End Date Taking? Authorizing Provider  ondansetron (ZOFRAN) 4 MG tablet Take 1 tablet (4 mg total) by mouth every 8 (eight) hours as needed for nausea or vomiting. 06/25/22   Theron Arista, PA-C  promethazine (PHENERGAN) 25 MG tablet Take 1 tablet (25 mg total) by mouth every 6 (six) hours as needed for nausea or vomiting. 12/11/22   Gareth Eagle, PA-C      Allergies    Patient has no known allergies.    Review of Systems   Review of Systems  Gastrointestinal:  Positive for abdominal pain and vomiting.    Physical Exam   Vitals:   12/11/22 0931  BP: (!) 149/83  Pulse: 60  Resp: 16  Temp: 98 F (36.7 C)  SpO2: 98%    CONSTITUTIONAL:  well-appearing, NAD NEURO:  Alert and oriented x 3, CN 3-12 grossly intact EYES:  eyes equal and reactive ENT/NECK:  Supple, no stridor  CARDIO:  regular rate and rhythm, appears well-perfused   PULM:  No respiratory distress, CTAB GI/GU:  non-distended, soft, upper abdominal tenderness MSK/SPINE:  No gross deformities, no edema, moves all extremities  SKIN:  no rash, atraumatic  *Additional and/or pertinent findings included in MDM below  ED Results / Procedures / Treatments   Labs (all labs ordered are listed, but only abnormal results are displayed) Labs Reviewed  COMPREHENSIVE METABOLIC PANEL - Abnormal; Notable for the following components:      Result Value   Glucose, Bld 114 (*)    Calcium 8.8 (*)    All other components within normal limits  CBC WITH DIFFERENTIAL/PLATELET  LIPASE, BLOOD  URINALYSIS, ROUTINE W REFLEX MICROSCOPIC    EKG None  Radiology CT ABDOMEN PELVIS W CONTRAST  Result Date: 12/11/2022 CLINICAL DATA:  Acute, nonlocalized abdominal pain EXAM: CT ABDOMEN AND PELVIS WITH CONTRAST TECHNIQUE: Multidetector CT imaging of the abdomen and pelvis was performed using the standard protocol following bolus administration of intravenous contrast. RADIATION DOSE REDUCTION: This exam was performed according to the departmental dose-optimization program which includes automated exposure control, adjustment of the mA and/or kV according to patient size and/or use of iterative reconstruction technique. CONTRAST:  75mL OMNIPAQUE IOHEXOL 350 MG/ML SOLN COMPARISON:  None Available. FINDINGS: Lower chest:  No contributory findings. Hepatobiliary: No focal liver abnormality.No evidence of biliary obstruction or stone. Pancreas: Unremarkable. Spleen: Unremarkable. Adrenals/Urinary  Tract: Negative adrenals. No hydronephrosis or stone. Unremarkable bladder. Stomach/Bowel: No obstruction. Appendectomy. No visible bowel inflammation. Vascular/Lymphatic: No acute vascular abnormality. No mass or adenopathy. Reproductive:No pathologic findings. Other: No ascites or pneumoperitoneum. Musculoskeletal: No acute abnormalities. IMPRESSION: No acute finding or explanation for symptoms.  Appendectomy. Electronically Signed   By: Tiburcio Pea M.D.   On: 12/11/2022 10:28    Procedures Procedures  {Document cardiac monitor, telemetry assessment procedure when appropriate:1}  Medications Ordered in ED Medications  ketorolac (TORADOL) 30 MG/ML injection 15 mg (has no administration in time range)  sodium chloride 0.9 % bolus 1,000 mL (0 mLs Intravenous Stopped 12/11/22 1114)  haloperidol lactate (HALDOL) injection 5 mg (5 mg Intravenous Given 12/11/22 0952)  ondansetron (ZOFRAN) injection 4 mg (4 mg Intravenous Given 12/11/22 0952)  iohexol (OMNIPAQUE) 350 MG/ML injection 75 mL (75 mLs Intravenous Contrast Given 12/11/22 1003)    ED Course/ Medical Decision Making/ A&P   {   Click here for ABCD2, HEART and other calculatorsREFRESH Note before signing :1}                              Medical Decision Making Amount and/or Complexity of Data Reviewed Labs: ordered. Radiology: ordered.  Risk Prescription drug management.   Initial Impression and Ddx 37 year old well-appearing male presenting for abdominal pain and emesis.  Exam notable for generalized abdominal tenderness in the upper abdomen.  DDx includes pancreatitis, acute cholecystitis, other intra-abdominal infection, electrolyte derangement, cannabinoid hyperemesis syndrome, ACS. Patient PMH that increases complexity of ED encounter: cyclic vomiting syndrome  Interpretation of Diagnostics - I independent reviewed and interpreted the labs as followed: hyperglycemia  - I independently visualized the following imaging with scope of interpretation limited to determining acute life threatening conditions related to emergency care: CT ab/pelvis, which revealed no acute findings  Patient Reassessment and Ultimate Disposition/Management After treatment, patient stated that symptoms had improved.  No witnessed emesis during encounter.  Have low suspicion for ACS.  Suspect cannabinoid hyperemesis syndrome.  Have low  suspicion for intra-abdominal infection given reassuring workup.  Advise conservative treatment at home.  Also recommended follow-up with GI given the persistence of his vomiting as patient states he has not had marijuana last 3 days.  Discussed.  Return precautions.  Vital stable.  Fluid challenge with no issue.  Discharge.  Patient management required discussion with the following services or consulting groups:  None  Complexity of Problems Addressed Acute complicated illness or Injury  Additional Data Reviewed and Analyzed Further history obtained from: Further history from spouse/family member, Past medical history and medications listed in the EMR, and Prior ED visit notes  Patient Encounter Risk Assessment Prescriptions   {Document critical care time when appropriate:1} {Document review of labs and clinical decision tools ie heart score, Chads2Vasc2 etc:1}  {Document your independent review of radiology images, and any outside records:1} {Document your discussion with family members, caretakers, and with consultants:1} {Document social determinants of health affecting pt's care:1} {Document your decision making why or why not admission, treatments were needed:1} Final Clinical Impression(s) / ED Diagnoses Final diagnoses:  Nausea and vomiting, unspecified vomiting type  Abdominal pain, unspecified abdominal location    Rx / DC Orders ED Discharge Orders          Ordered    promethazine (PHENERGAN) 25 MG tablet  Every 6 hours PRN        12/11/22 1231

## 2022-12-11 NOTE — ED Notes (Signed)
Tried to obtain urin but patient stated that he was not able to urinate yet. Family is attempting to get hit to at lest try

## 2022-12-11 NOTE — Discharge Instructions (Signed)
Evaluation today was overall reassuring.  Recommend you follow-up with gastroenterology for persistence of your nausea and vomiting.  Sent over a refill of promethazine to your pharmacy.  If you have worsening abdominal pain, intractable vomiting, inability to tolerate fluid intake, develop a fever or any other concern please return emergency department for further evaluation.

## 2022-12-20 ENCOUNTER — Emergency Department (HOSPITAL_BASED_OUTPATIENT_CLINIC_OR_DEPARTMENT_OTHER)
Admission: EM | Admit: 2022-12-20 | Discharge: 2022-12-20 | Disposition: A | Discharge status: Home / Self Care | Payer: Medicaid Other | Attending: Emergency Medicine | Admitting: Emergency Medicine

## 2022-12-20 ENCOUNTER — Encounter (HOSPITAL_BASED_OUTPATIENT_CLINIC_OR_DEPARTMENT_OTHER): Payer: Self-pay | Admitting: Emergency Medicine

## 2022-12-20 ENCOUNTER — Other Ambulatory Visit: Payer: Self-pay

## 2022-12-20 DIAGNOSIS — R1013 Epigastric pain: Secondary | ICD-10-CM | POA: Insufficient documentation

## 2022-12-20 DIAGNOSIS — R1110 Vomiting, unspecified: Secondary | ICD-10-CM | POA: Diagnosis present

## 2022-12-20 DIAGNOSIS — R1115 Cyclical vomiting syndrome unrelated to migraine: Secondary | ICD-10-CM | POA: Diagnosis not present

## 2022-12-20 LAB — COMPREHENSIVE METABOLIC PANEL
ALT: 19 U/L (ref 0–44)
AST: 22 U/L (ref 15–41)
Albumin: 4.7 g/dL (ref 3.5–5.0)
Alkaline Phosphatase: 41 U/L (ref 38–126)
Anion gap: 13 (ref 5–15)
BUN: 11 mg/dL (ref 6–20)
CO2: 23 mmol/L (ref 22–32)
Calcium: 9.6 mg/dL (ref 8.9–10.3)
Chloride: 103 mmol/L (ref 98–111)
Creatinine, Ser: 0.94 mg/dL (ref 0.61–1.24)
GFR, Estimated: >60 mL/min (ref >60)
Glucose, Bld: 117 mg/dL — ABNORMAL HIGH (ref 70–99)
Potassium: 3.9 mmol/L (ref 3.5–5.1)
Sodium: 139 mmol/L (ref 135–145)
Total Bilirubin: 0.8 mg/dL (ref 0.3–1.2)
Total Protein: 8.1 g/dL (ref 6.5–8.1)

## 2022-12-20 LAB — CBC
HCT: 44.2 % (ref 39.0–52.0)
Hemoglobin: 15.6 g/dL (ref 13.0–17.0)
MCH: 32 pg (ref 26.0–34.0)
MCHC: 35.3 g/dL (ref 30.0–36.0)
MCV: 90.8 fL (ref 80.0–100.0)
Platelets: 241 10*3/uL (ref 150–400)
RBC: 4.87 MIL/uL (ref 4.22–5.81)
RDW: 12.9 % (ref 11.5–15.5)
WBC: 7.4 10*3/uL (ref 4.0–10.5)
nRBC: 0 % (ref 0.0–0.2)

## 2022-12-20 LAB — LIPASE, BLOOD: Lipase: 17 U/L (ref 11–51)

## 2022-12-20 MED ORDER — HALOPERIDOL LACTATE 5 MG/ML IJ SOLN
5.0000 mg | Freq: Once | INTRAMUSCULAR | Status: AC
  Administered 2022-12-20: 5 mg via INTRAVENOUS
  Filled 2022-12-20: qty 1

## 2022-12-20 MED ORDER — SODIUM CHLORIDE 0.9 % IV BOLUS
1000.0000 mL | Freq: Once | INTRAVENOUS | Status: AC
  Administered 2022-12-20: 1000 mL via INTRAVENOUS

## 2022-12-20 MED ORDER — ONDANSETRON HCL 4 MG/2ML IJ SOLN
4.0000 mg | Freq: Four times a day (QID) | INTRAMUSCULAR | Status: DC
  Administered 2022-12-20: 4 mg via INTRAVENOUS
  Filled 2022-12-20: qty 2

## 2022-12-20 MED ORDER — CAPSAICIN 0.075 % EX CREA
TOPICAL_CREAM | Freq: Two times a day (BID) | CUTANEOUS | Status: DC
  Filled 2022-12-20: qty 60

## 2022-12-20 NOTE — Discharge Instructions (Addendum)
I am glad you are feeling better. I have placed an ambulatory referral to [facility] office here in [place]. I also provided with information to obtain primary care in this discharge paperwork. Seek emergency care if experiencing any new or worsening symptoms.

## 2022-12-20 NOTE — ED Provider Notes (Signed)
[place] EMERGENCY DEPARTMENT AT [facility] Provider Note   CSN: [id number] Arrival date & time: 12/20/22  1524     History  Chief Complaint  Patient presents with   Emesis    Reginald Choi is a 37 y.o. male with PMHx cyclic vomiting syndrome who presents to ED concerned for vomiting since 2AM last night. Patient also stating that the vomiting is causing his abdomen to hurt/cramp. Patient refusing abdominal imaging at this time stating that this has happened to him multiple times in the past and he had negative imaging multiple times. Denies sick contacts or concerns for food poisoning. Patient with daily cannabis use x26 years. Last BM today. Patient stating that when these episodes happen, it is relieved with a medication that makes him sleep for a couple of hours along with some IV fluids.  Denies fever, dyspnea, hematemesis, hematochezia, dysuria, hematuria.   Emesis      Home Medications Prior to Admission medications   Medication Sig Start Date End Date Taking? Authorizing Provider  ondansetron (ZOFRAN) 4 MG tablet Take 1 tablet (4 mg total) by mouth every 8 (eight) hours as needed for nausea or vomiting. 06/25/22   Theron Arista, PA-C  promethazine (PHENERGAN) 25 MG tablet Take 1 tablet (25 mg total) by mouth every 6 (six) hours as needed for nausea or vomiting. 12/11/22   Gareth Eagle, PA-C      Allergies    Patient has no known allergies.    Review of Systems   Review of Systems  Gastrointestinal:  Positive for vomiting.    Physical Exam Updated Vital Signs BP 110/68 (BP Location: Right Arm)   Pulse 74   Temp 99 F (37.2 C) (Oral)   Resp 18   Ht 6\' 2"  (1.88 m)   Wt 70.3 kg   SpO2 98%   BMI 19.90 kg/m  Physical Exam Vitals and nursing note reviewed.  Constitutional:      General: He is not in acute distress.    Appearance: He is not ill-appearing or toxic-appearing.  HENT:     Head: Normocephalic and atraumatic.     Mouth/Throat:      Mouth: Mucous membranes are moist.  Eyes:     General: No scleral icterus.       Right eye: No discharge.        Left eye: No discharge.     Conjunctiva/sclera: Conjunctivae normal.  Cardiovascular:     Rate and Rhythm: Normal rate and regular rhythm.     Pulses: Normal pulses.     Heart sounds: Normal heart sounds. No murmur heard. Pulmonary:     Effort: Pulmonary effort is normal.  Abdominal:     General: Abdomen is flat. Bowel sounds are normal. There is no distension.     Palpations: Abdomen is soft. There is no mass.     Comments: Tenderness to palpation of epigastric area  Musculoskeletal:     Right lower leg: No edema.     Left lower leg: No edema.  Skin:    General: Skin is warm and dry.     Findings: No rash.  Neurological:     General: No focal deficit present.     Mental Status: He is alert. Mental status is at baseline.  Psychiatric:        Mood and Affect: Mood normal.     ED Results / Procedures / Treatments   Labs (all labs ordered are listed, but only abnormal results are displayed)  Labs Reviewed  COMPREHENSIVE METABOLIC PANEL - Abnormal; Notable for the following components:      Result Value   Glucose, Bld 117 (*)    All other components within normal limits  LIPASE, BLOOD  CBC    EKG None  Radiology No results found.  Procedures Procedures    Medications Ordered in ED Medications  ondansetron (ZOFRAN) injection 4 mg (4 mg Intravenous Given 12/20/22 1728)  capsicum (ZOSTRIX) 0.075 % cream ( Topical Given 12/20/22 2110)  sodium chloride 0.9 % bolus 1,000 mL ( Intravenous Stopped 12/20/22 1831)  haloperidol lactate (HALDOL) injection 5 mg (5 mg Intravenous Given 12/20/22 1835)    ED Course/ Medical Decision Making/ A&P                                 Medical Decision Making Amount and/or Complexity of Data Reviewed Labs: ordered.  Risk OTC drugs. Prescription drug management.    This patient presents to the ED for concern of vomiting  and abdominal pain, this involves an extensive number of treatment options, and is a complaint that carries with it a high risk of complications and morbidity.  The differential diagnosis includes gastroenteritis, colitis, small bowel obstruction, appendicitis, cholecystitis, pancreatitis, nephrolithiasis, UTI, pyleonephritis.   Co morbidities that complicate the patient evaluation  Cyclic vomiting symdrome   Lab Tests:  I Ordered, and personally interpreted labs.  The pertinent results include: CBC with differential: No concern for anemia or leukocytosis CMP: no concern for electrolyte abnormality; no concern for kidney/liver damage Lipase: within normal limts    Problem List / ED Course / Critical interventions / Medication management  Patient presented for abdominal pain and vomiting.  Physical exam with tenderness to palpation of epigastric area.  Patient refusing imaging today stating that he has had these symptoms multiple times over the past couple years and has had negative CT imaging in the past. Patient specifically requesting GI referral because he is sure that his daily cannabis use is not related to his cyclic vomiting syndrome. I have placed ambulatory GI referral. Patient stating that these episodes resolve after receiving IV fluids and a medication that makes him sleep for a couple of hours. Per chart review, it appears that patient's symptoms resolve well with Haldol and IV fluids.  CBC without leukocytosis or anemia. CMP without electrolyte abnormality. BUN/Cr within normal limits. Liver function test within normal limits. Lipase within normal limits. Patient afebrile with stable vitals.  Patient states that he feels a lot better after IV fluids and Haldol. Educated patient that daily cannabis use could be exacerbating symptoms.  Patient stating that he does not have a PCP. Provided info in discharge paperwork for PCP. Recommending obtaining PCP care and following up -  patient verbalized understanding of plan. Patient asking to be discharged. I have reviewed the patients home medicines and have made adjustments as needed Patient was given return precautions. Patient stable for discharge at this time.  Patient verbalized understanding of plan.  Ddx: These are considered less likely due to history of present illness and physical exam. -colitis: Denies diarrhea, patient afebrile  -small bowel obstruction: Last BM today  -appendicitis: Negative McBurney point, rebound tenderness, psoas, obturator sign  -cholecystitis: Negative Murphy sign; liver enzymes within normal limits  -pancreatitis: No LUQ tenderness to palpation, lipase within normal limits  -nephrolithiasis: Denies flank pain and urinary complaints  -UTI/pyelonephritis: Denies urinary complaints    Social Determinants  of Health:  none           Final Clinical Impression(s) / ED Diagnoses Final diagnoses:  Cyclic vomiting syndrome  Epigastric pain    Rx / DC Orders ED Discharge Orders          Ordered    Ambulatory referral to Gastroenterology        12/20/22 2140              Margarita Rana 12/20/22 2151    Rondel Baton, MD 12/21/22 1225

## 2022-12-20 NOTE — ED Triage Notes (Signed)
Pt arrives nausea and vomiting since 1am this morning.

## 2022-12-23 ENCOUNTER — Encounter: Payer: Self-pay | Admitting: Physician Assistant

## 2022-12-23 ENCOUNTER — Ambulatory Visit (INDEPENDENT_AMBULATORY_CARE_PROVIDER_SITE_OTHER): Payer: Medicaid Other | Admitting: Physician Assistant

## 2022-12-23 VITALS — BP 100/60 | HR 80 | Ht 73.0 in | Wt 158.2 lb

## 2022-12-23 DIAGNOSIS — R112 Nausea with vomiting, unspecified: Secondary | ICD-10-CM

## 2022-12-23 DIAGNOSIS — F1290 Cannabis use, unspecified, uncomplicated: Secondary | ICD-10-CM

## 2022-12-23 DIAGNOSIS — R1013 Epigastric pain: Secondary | ICD-10-CM

## 2022-12-23 MED ORDER — ONDANSETRON 4 MG PO TBDP: 4.0000 mg | ORAL_TABLET | Freq: Four times a day (QID) | ORAL | 3 refills | Status: DC | PRN

## 2022-12-23 MED ORDER — OMEPRAZOLE 40 MG PO CPDR: 40.0000 mg | DELAYED_RELEASE_CAPSULE | Freq: Every morning | ORAL | 6 refills | Status: DC

## 2022-12-23 NOTE — Patient Instructions (Addendum)
_______________________________________________________  If your blood pressure at your visit was 140/90 or greater, please contact your primary care physician to follow up on this.  If you are age 36 or younger, your body mass index should be between 19-25. Your Body mass index is 20.88 kg/m. If this is out of the aformentioned range listed, please consider follow up with your Primary Care Provider.  ________________________________________________________  The [facility] providers would like to encourage you to use [facility] to communicate with providers for non-urgent requests or questions.  Due to long hold times on the telephone, sending your provider a message by [facility] may be a faster and more efficient way to get a response.  Please allow 48 business hours for a response.  Please remember that this is for non-urgent requests.  _______________________________________________________  We have sent the following medications to your pharmacy for you to pick up at your convenience:  START: omeprazole 40mg  one capsule every morning before breakfast each day.  CHANGE: Zofran ODT to 4mg  every 6 hours as needed for nausea and vomiting.   You have been scheduled for an endoscopy. Please follow written instructions given to you at your visit today.  If you use inhalers (even only as needed), please bring them with you on the day of your procedure.  If you take any of the following medications, they will need to be adjusted prior to your procedure:   DO NOT TAKE 7 DAYS PRIOR TO TEST- Trulicity (dulaglutide) Ozempic, Wegovy (semaglutide) Mounjaro (tirzepatide) Bydureon Bcise (exanatide extended release)  DO NOT TAKE 1 DAY PRIOR TO YOUR TEST Rybelsus (semaglutide) Adlyxin (lixisenatide) Victoza (liraglutide) Byetta (exanatide) ___________________________________________________________________________  PLEASE decrease cannabis use.  Due to recent changes in healthcare laws, you may see  the results of your imaging and laboratory studies on MyChart before your provider has had a chance to review them.  We understand that in some cases there may be results that are confusing or concerning to you. Not all laboratory results come back in the same time frame and the provider may be waiting for multiple results in order to interpret others.  Please give [place] 48 hours in order for your provider to thoroughly review all the results before contacting the office for clarification of your results.   Thank you for entrusting me with your care and choosing [facility],  Amy Mariano Colan, [place]

## 2022-12-24 ENCOUNTER — Encounter: Payer: Self-pay | Admitting: Physician Assistant

## 2022-12-24 DIAGNOSIS — F1290 Cannabis use, unspecified, uncomplicated: Secondary | ICD-10-CM | POA: Insufficient documentation

## 2022-12-24 DIAGNOSIS — R112 Nausea with vomiting, unspecified: Secondary | ICD-10-CM | POA: Insufficient documentation

## 2022-12-24 NOTE — Progress Notes (Signed)
I agree with the assessment and plan as outlined by Ms. Esterwood. ?

## 2022-12-24 NOTE — Progress Notes (Signed)
Subjective:    Patient ID: Reginald Choi, male    DOB: 09/10/1985, 37 y.o.   MRN: [id number]  HPI Reginald Choi is a 37 year old African-American male, new to GI today referred after recent ER visit on 12/20/2022 with complaints of recurrent nausea vomiting and epigastric pain.  Patient has not had any prior GI evaluation.  He has had 5 previous ER visits this year all with similar complaints of intractable nausea and vomiting.  He did have CT of the abdomen and pelvis done on 12/11/2022 which was unremarkable other than prior appendectomy. Labs at that time showed normal CBC, normal c-Met and normal lipase.  Patient says he had his appendix out 7 to 8 years ago, and that his current symptoms with repeated prolonged episodes of nausea and vomiting have been occurring over the past 6 to 7 years.  He says that these episodes have become more frequent and used to only occur once or twice per year.  He now has an episode at least once per month.  He says he becomes very nauseated, gets gnawing epigastric discomfort, hiccups then starts vomiting and once he starts vomiting he gets to the point that he cannot keep anything down and continues to vomit to the point of bilious material.  Sometimes he gets cold sweats with this, he does not have associated diarrhea, no hematemesis, no melena or hematochezia. No regular EtOH, no regular aspirin or NSAIDs.  He does use marijuana chronically usually twice per day and says he has been using daily marijuana for many years. Usually with IV fluids IV antiemetics a dose of analgesic his symptoms will improve when he is discharged from the ER then gradually has to work his way back up on his oral intake over the next few days.  Has lost some weight over the past few months due to recurrent issues. He does feel that he has intermittent periods of time where he feels fine and does not have daily GI symptoms though as above these episodes have gotten more frequent.  He also gets a  lot of heartburn and indigestion with these episodes but does not have it in between episodes necessarily.  He does not feel that oral Zofran is very helpful because usually he will vomit it back up, has been given Phenergan suppositories but says he is not comfortable using these.    Review of Systems Pertinent positive and negative review of systems were noted in the above HPI section.  All other review of systems was otherwise negative.   Outpatient Encounter Medications as of 12/23/2022  Medication Sig   omeprazole (PRILOSEC) 40 MG capsule Take 1 capsule (40 mg total) by mouth every morning.   ondansetron (ZOFRAN-ODT) 4 MG disintegrating tablet Take 1 tablet (4 mg total) by mouth every 6 (six) hours as needed for nausea or vomiting.   promethazine (PHENERGAN) 25 MG tablet Take 1 tablet (25 mg total) by mouth every 6 (six) hours as needed for nausea or vomiting.   [DISCONTINUED] ondansetron (ZOFRAN) 4 MG tablet Take 1 tablet (4 mg total) by mouth every 8 (eight) hours as needed for nausea or vomiting.   No facility-administered encounter medications on file as of 12/23/2022.   No Known Allergies Patient Active Problem List   Diagnosis Date Noted   Nausea and vomiting in adult 12/24/2022   Social History   Socioeconomic History   Marital status: Single    Spouse name: Not on file   Number of children: 3  Years of education: Not on file   Highest education level: Not on file  Occupational History   Occupation: Estate agent  Tobacco Use   Smoking status: Every Day    Types: Cigarettes   Smokeless tobacco: Never  Vaping Use   Vaping status: Never Used  Substance and Sexual Activity   Alcohol use: Yes    Comment: an occasional shot   Drug use: Yes    Types: Marijuana   Sexual activity: Not on file  Other Topics Concern   Not on file  Social History Narrative   Not on file   Social Determinants of Health   Financial Resource Strain: Not on file  Food Insecurity: Not  on file  Transportation Needs: Not on file  Physical Activity: Not on file  Stress: Not on file  Social Connections: Not on file  Intimate Partner Violence: Not on file    [name] family history includes Diabetes in his mother; Hypertension in his mother.      Objective:    Vitals:   12/23/22 1322  BP: 100/60  Pulse: 80    Physical Exam. Well-developed well-nourished young African-American male in no acute distress.  Height, Weight, 158 BMI 20.8  HEENT; nontraumatic normocephalic, EOMI, PE R LA, sclera anicteric. Oropharynx; not examined today Neck; supple, no JVD Cardiovascular; regular rate and rhythm with S1-S2, no murmur rub or gallop Pulmonary; Clear bilaterally Abdomen; soft, nontender, nondistended, no palpable mass or hepatosplenomegaly, bowel sounds are active Rectal; not done today Skin; benign exam, no jaundice rash or appreciable lesions Extremities; no clubbing cyanosis or edema skin warm and dry Neuro/Psych; alert and oriented x4, grossly nonfocal mood and affect appropriate        Assessment & Plan:   #8 37 year old African-American male with recurrent episodes of prolonged nausea and vomiting which becomes intractable.  He has been having symptoms over the past 6 to 7 years but more frequently over the past year or so and now occurring once a month or so and will last for a few days. He has had several ER visits with above symptoms He does get some epigastric gnawing discomfort and hiccups prior to onset no ongoing chronic daily abdominal pain, no changes in bowel habits, no melena or hematochezia.  Recent labs August 2024 unremarkable CT of the abdomen pelvis 12/11/2022 unremarkable status post appendectomy.  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 Also need to  consider chronic gastritis, peptic ulcer disease, gastroparesis.  Plan; Discussion today with the patient regarding the correlation between chronic cannabis use and recurring nausea and vomiting. Start omeprazole 40 mg p.o. every morning AC breakfast. Will send prescription for Zofran and ODT 4 mg every 6 hours as needed.  I have asked him to take this at first onset of nausea and then continue taking regular doses over the next 24 to 48 hours in hopes that this may prevent ER visits. Asked him to start with a clear liquid diet, and very gradually advance his diet over a couple of days during 1 of these episodes. Also explained rationale for suppositories in setting of intractable vomiting, he does have Phenergan suppositories at home. Will schedule for upper endoscopy with Dr. [name].  Procedure was discussed in detail with the patient including indications risks and benefits and he is agreeable to proceed. Advised best advice is  to wean himself off of cannabis.  Tonee Silverstein S Sawsan Riggio PA-C 12/24/2022   Cc: Dorthy Cooler, [place]*

## 2022-12-27 ENCOUNTER — Encounter: Payer: Self-pay | Admitting: Internal Medicine

## 2022-12-27 ENCOUNTER — Ambulatory Visit (AMBULATORY_SURGERY_CENTER): Payer: Medicaid Other | Admitting: Internal Medicine

## 2022-12-27 VITALS — BP 113/75 | HR 89 | Temp 99.3°F | Resp 19 | Ht 73.0 in | Wt 158.0 lb

## 2022-12-27 DIAGNOSIS — K2980 Duodenitis without bleeding: Secondary | ICD-10-CM

## 2022-12-27 DIAGNOSIS — K2990 Gastroduodenitis, unspecified, without bleeding: Secondary | ICD-10-CM | POA: Diagnosis not present

## 2022-12-27 DIAGNOSIS — K319 Disease of stomach and duodenum, unspecified: Secondary | ICD-10-CM | POA: Diagnosis not present

## 2022-12-27 DIAGNOSIS — K2970 Gastritis, unspecified, without bleeding: Secondary | ICD-10-CM

## 2022-12-27 DIAGNOSIS — R112 Nausea with vomiting, unspecified: Secondary | ICD-10-CM | POA: Diagnosis not present

## 2022-12-27 MED ORDER — SODIUM CHLORIDE 0.9 % IV SOLN: 500.0000 mL | Freq: Once | INTRAVENOUS | Status: DC

## 2022-12-27 NOTE — Progress Notes (Signed)
Called to room to assist during endoscopic procedure.  Patient ID and intended procedure confirmed with present staff. Received instructions for my participation in the procedure from the performing physician.  

## 2022-12-27 NOTE — Progress Notes (Signed)
Patient states smoked marijuana this morning at 3 am.

## 2022-12-27 NOTE — Progress Notes (Signed)
Report to PACU, RN, vss, BBS= Clear.  

## 2022-12-27 NOTE — Progress Notes (Signed)
GASTROENTEROLOGY PROCEDURE H&P NOTE   Primary Care Physician: Patient, No Pcp Per    Reason for Procedure:   N&V, epigastric ab pain  Plan:    EGD  Patient is appropriate for endoscopic procedure(s) in the ambulatory ([facility]) setting.  The nature of the procedure, as well as the risks, benefits, and alternatives were carefully and thoroughly reviewed with the patient. Ample time for discussion and questions allowed. The patient understood, was satisfied, and agreed to proceed.     HPI: Reginald Choi is a 37 y.o. male who presents for EGD for evaluation of N&V, and epigastric ab pain .  Patient was most recently seen in the Gastroenterology Clinic on 12/23/22.  No interval change in medical history since that appointment. Please refer to that note for full details regarding GI history and clinical presentation.   Past Medical History:  Diagnosis Date   No pertinent past medical history     Past Surgical History:  Procedure Laterality Date   APPENDECTOMY     CIRCUMCISION     age 81    Prior to Admission medications   Medication Sig Start Date End Date Taking? Authorizing Provider  omeprazole (PRILOSEC) 40 MG capsule Take 1 capsule (40 mg total) by mouth every morning. 12/23/22   Esterwood, Amy S, PA-C  ondansetron (ZOFRAN-ODT) 4 MG disintegrating tablet Take 1 tablet (4 mg total) by mouth every 6 (six) hours as needed for nausea or vomiting. 12/23/22   Esterwood, Amy S, PA-C  promethazine (PHENERGAN) 25 MG tablet Take 1 tablet (25 mg total) by mouth every 6 (six) hours as needed for nausea or vomiting. 12/11/22   Gareth Eagle, PA-C    Current Outpatient Medications  Medication Sig Dispense Refill   omeprazole (PRILOSEC) 40 MG capsule Take 1 capsule (40 mg total) by mouth every morning. 30 capsule 6   ondansetron (ZOFRAN-ODT) 4 MG disintegrating tablet Take 1 tablet (4 mg total) by mouth every 6 (six) hours as needed for nausea or vomiting. 60 tablet 3   promethazine  (PHENERGAN) 25 MG tablet Take 1 tablet (25 mg total) by mouth every 6 (six) hours as needed for nausea or vomiting. 30 tablet 0   Current Facility-Administered Medications  Medication Dose Route Frequency Provider Last Rate Last Admin   0.9 %  sodium chloride infusion  500 mL Intravenous Once Imogene Burn, MD        Allergies as of 12/27/2022   (No Known Allergies)    Family History  Problem Relation Age of Onset   Diabetes Mother    Hypertension Mother     Social History   Socioeconomic History   Marital status: Single    Spouse name: Not on file   Number of children: 3   Years of education: Not on file   Highest education level: Not on file  Occupational History   Occupation: Estate agent  Tobacco Use   Smoking status: Every Day    Types: Cigarettes   Smokeless tobacco: Never  Vaping Use   Vaping status: Never Used  Substance and Sexual Activity   Alcohol use: Yes    Comment: an occasional shot   Drug use: Yes    Types: Marijuana   Sexual activity: Not on file  Other Topics Concern   Not on file  Social History Narrative   Not on file   Social Determinants of Health   Financial Resource Strain: Not on file  Food Insecurity: Not on file  Transportation Needs:  Not on file  Physical Activity: Not on file  Stress: Not on file  Social Connections: Not on file  Intimate Partner Violence: Not on file    Physical Exam: Vital signs in last 24 hours: BP 121/76   Pulse 70   Temp 99.3 F (37.4 C)   Ht 6\' 1"  (1.854 m)   Wt 158 lb (71.7 kg)   SpO2 98%   BMI 20.85 kg/m  GEN: NAD EYE: Sclerae anicteric ENT: MMM CV: Non-tachycardic Pulm: No increased WOB GI: Soft NEURO:  Alert & Oriented   Eulah Pont, MD [facility]   12/27/2022 1:49 PM

## 2022-12-27 NOTE — Op Note (Signed)
[facility] Patient Name: Reginald Choi Procedure Date: 12/27/2022 1:52 PM MRN: [id number] Endoscopist: Particia Lather , , [id number] Age: 37 Referring MD:  Date of Birth: 1985-09-23 Gender: Male Account #: [id number] Procedure:                Upper GI endoscopy Indications:              Epigastric abdominal pain, Nausea with vomiting Medicines:                Monitored Anesthesia Care Procedure:                Pre-Anesthesia Assessment:                           - Prior to the procedure, a History and Physical                            was performed, and patient medications and                            allergies were reviewed. The patient's tolerance of                            previous anesthesia was also reviewed. The risks                            and benefits of the procedure and the sedation                            options and risks were discussed with the patient.                            All questions were answered, and informed consent                            was obtained. Prior Anticoagulants: The patient has                            taken no anticoagulant or antiplatelet agents. ASA                            Grade Assessment: II - A patient with mild systemic                            disease. After reviewing the risks and benefits,                            the patient was deemed in satisfactory condition to                            undergo the procedure.                           After obtaining informed consent, the endoscope was  passed under direct vision. Throughout the                            procedure, the patient's blood pressure, pulse, and                            oxygen saturations were monitored continuously. The                            Olympus Scope [id number] was introduced through the                            mouth, and advanced to the second part of duodenum.                            The  upper GI endoscopy was accomplished without                            difficulty. The patient tolerated the procedure                            well. Scope In: Scope Out: Findings:                 The examined esophagus was normal.                           Localized inflammation characterized by congestion                            (edema) and erythema was found in the gastric                            fundus. Biopsies were taken with a cold forceps for                            histology.                           Localized inflammation characterized by congestion                            (edema) and erythema was found in the duodenal                            bulb. Biopsies were taken with a cold forceps for                            histology. Complications:            No immediate complications. Estimated Blood Loss:     Estimated blood loss was minimal. Impression:               - Normal esophagus.                           - Gastritis. Biopsied.                           -  Duodenitis. Biopsied. Recommendation:           - Discharge patient to home (with escort).                           - Await pathology results.                           - Use Prilosec (omeprazole) 40 mg PO BID for 8                            weeks, then decrease to daily.                           - Encourage marijuana cessation.                           - Return to GI clinic in 2-3 months.                           - The findings and recommendations were discussed                            with the patient. Dr Particia Lather "[name]" [name],  12/27/2022 2:13:03 PM

## 2022-12-27 NOTE — Patient Instructions (Addendum)
Thank you for letting us take care of your healthcare needs today. Increase your Omeprazole to twice a day for 8 weeks then decrease to daily. Follow up appt with Dr. [name] in 2 months. Per Dr. [name] stop Marijuana use.   YOU HAD AN ENDOSCOPIC PROCEDURE TODAY AT THE [facility]:   Refer to the procedure report that was given to you for any specific questions about what was found during the examination.  If the procedure report does not answer your questions, please call your gastroenterologist to clarify.  If you requested that your care partner not be given the details of your procedure findings, then the procedure report has been included in a sealed envelope for you to review at your convenience later.  YOU SHOULD EXPECT: Some feelings of bloating in the abdomen. Passage of more gas than usual.  Walking can help get rid of the air that was put into your GI tract during the procedure and reduce the bloating. If you had a lower endoscopy (such as a colonoscopy or flexible sigmoidoscopy) you may notice spotting of blood in your stool or on the toilet paper. If you underwent a bowel prep for your procedure, you may not have a normal bowel movement for a few days.  Please Note:  You might notice some irritation and congestion in your nose or some drainage.  This is from the oxygen used during your procedure.  There is no need for concern and it should clear up in a day or so.  SYMPTOMS TO REPORT IMMEDIATELY:   Following upper endoscopy (EGD)  Vomiting of blood or coffee ground material  New chest pain or pain under the shoulder blades  Painful or persistently difficult swallowing  New shortness of breath  Fever of 100F or higher  Black, tarry-looking stools  For urgent or emergent issues, a gastroenterologist can be reached at any hour by calling [phone]. Do not use MyChart messaging for urgent concerns.    DIET:  We do recommend a small meal at first, but then you may  proceed to your regular diet.  Drink plenty of fluids but you should avoid alcoholic beverages for 24 hours.  ACTIVITY:  You should plan to take it easy for the rest of today and you should NOT DRIVE or use heavy machinery until tomorrow (because of the sedation medicines used during the test).    FOLLOW UP: Our staff will call the number listed on your records the next business day following your procedure.  We will call around 7:15- 8:00 am to check on you and address any questions or concerns that you may have regarding the information given to you following your procedure. If we do not reach you, we will leave a message.     If any biopsies were taken you will be contacted by phone or by letter within the next 1-3 weeks.  Please call us at [phone] if you have not heard about the biopsies in 3 weeks.    SIGNATURES/CONFIDENTIALITY: You and/or your care partner have signed paperwork which will be entered into your electronic medical record.  These signatures attest to the fact that that the information above on your After Visit Summary has been reviewed and is understood.  Full responsibility of the confidentiality of this discharge information lies with you and/or your care-partner.

## 2022-12-28 ENCOUNTER — Telehealth: Payer: Self-pay

## 2022-12-28 NOTE — Telephone Encounter (Signed)
  Follow up Call-     12/27/2022    1:13 PM  Call back number  Post procedure Call Back phone  # [phone]  Permission to leave phone message Yes     Patient questions:  Do you have a fever, pain , or abdominal swelling? Yes.   Pain Score  3 *  Have you tolerated food without any problems? No.  Have you been able to return to your normal activities? Yes.    Do you have any questions about your discharge instructions: Diet   No. Medications  No. Follow up visit  No.  Do you have questions or concerns about your Care? No.  Actions: * If pain score is 4 or above: No action needed, pain <4. Patient instructed to pick medication up and follow directions. Wait 30 minutes to eat. Avoid fried fatty greasy food at first then gradually go back to regular diet. Call back if abdominal discomfort continues.

## 2022-12-30 LAB — SURGICAL PATHOLOGY

## 2023-01-03 ENCOUNTER — Encounter: Payer: Self-pay | Admitting: Internal Medicine

## 2023-03-01 ENCOUNTER — Ambulatory Visit: Payer: Medicaid Other | Admitting: Internal Medicine

## 2023-06-20 ENCOUNTER — Emergency Department (HOSPITAL_COMMUNITY)
Admission: EM | Admit: 2023-06-20 | Discharge: 2023-06-21 | Disposition: A | Discharge status: Home / Self Care | Attending: Student | Admitting: Student

## 2023-06-20 DIAGNOSIS — R112 Nausea with vomiting, unspecified: Secondary | ICD-10-CM | POA: Diagnosis present

## 2023-06-20 LAB — CBC
HCT: 42.8 % (ref 39.0–52.0)
Hemoglobin: 14.8 g/dL (ref 13.0–17.0)
MCH: 31.4 pg (ref 26.0–34.0)
MCHC: 34.6 g/dL (ref 30.0–36.0)
MCV: 90.9 fL (ref 80.0–100.0)
Platelets: 213 10*3/uL (ref 150–400)
RBC: 4.71 MIL/uL (ref 4.22–5.81)
RDW: 12.5 % (ref 11.5–15.5)
WBC: 8.2 10*3/uL (ref 4.0–10.5)
nRBC: 0 % (ref 0.0–0.2)

## 2023-06-20 LAB — COMPREHENSIVE METABOLIC PANEL
ALT: 27 U/L (ref 0–44)
AST: 33 U/L (ref 15–41)
Albumin: 4.8 g/dL (ref 3.5–5.0)
Alkaline Phosphatase: 52 U/L (ref 38–126)
Anion gap: 10 (ref 5–15)
BUN: 11 mg/dL (ref 6–20)
CO2: 21 mmol/L — ABNORMAL LOW (ref 22–32)
Calcium: 9.6 mg/dL (ref 8.9–10.3)
Chloride: 105 mmol/L (ref 98–111)
Creatinine, Ser: 1.02 mg/dL (ref 0.61–1.24)
GFR, Estimated: >60 mL/min (ref >60)
Glucose, Bld: 117 mg/dL — ABNORMAL HIGH (ref 70–99)
Potassium: 3.7 mmol/L (ref 3.5–5.1)
Sodium: 136 mmol/L (ref 135–145)
Total Bilirubin: 1 mg/dL (ref 0.0–1.2)
Total Protein: 8.5 g/dL — ABNORMAL HIGH (ref 6.5–8.1)

## 2023-06-20 LAB — LIPASE, BLOOD: Lipase: 29 U/L (ref 11–51)

## 2023-06-20 NOTE — ED Triage Notes (Signed)
 Pt reports cyclic vomiting episode x this morning , smoked yesterday. Pt reports taking promethazine pills with no relief, pain 8/10 in abd when vomiting

## 2023-06-21 DIAGNOSIS — R112 Nausea with vomiting, unspecified: Secondary | ICD-10-CM | POA: Diagnosis not present

## 2023-06-21 MED ORDER — HALOPERIDOL LACTATE 5 MG/ML IJ SOLN
5.0000 mg | Freq: Once | INTRAMUSCULAR | Status: AC
  Administered 2023-06-21: 5 mg via INTRAVENOUS
  Filled 2023-06-21: qty 1

## 2023-06-21 MED ORDER — KETOROLAC TROMETHAMINE 30 MG/ML IJ SOLN
30.0000 mg | Freq: Once | INTRAMUSCULAR | Status: AC
  Administered 2023-06-21: 30 mg via INTRAVENOUS
  Filled 2023-06-21: qty 1

## 2023-06-21 MED ORDER — ONDANSETRON HCL 4 MG/2ML IJ SOLN
4.0000 mg | Freq: Once | INTRAMUSCULAR | Status: AC
  Administered 2023-06-21: 4 mg via INTRAVENOUS
  Filled 2023-06-21: qty 2

## 2023-06-21 MED ORDER — LACTATED RINGERS IV BOLUS
1000.0000 mL | Freq: Once | INTRAVENOUS | Status: AC
  Administered 2023-06-21: 1000 mL via INTRAVENOUS

## 2023-06-21 NOTE — ED Notes (Signed)
 Patient d/c with home care instructions. IV discontinued.

## 2023-06-21 NOTE — ED Provider Notes (Signed)
 [place] EMERGENCY DEPARTMENT AT [facility] Provider Note   CSN: [id number] Arrival date & time: 06/20/23  2125     History  Chief Complaint  Patient presents with   Emesis    Reginald Choi is a 38 y.o. male with history of cyclical vomiting syndrome who presents with concern for nausea and vomiting that started the morning and 06/20/2023 and has persisted throughout the day.  States he has had multiple episodes of nonbloody emesis.  Denies any diarrhea.  Reports mild abdominal pain when he vomits.  He does report smoking marijuana.  Reports taking promethazine pills at home without relief.  Denies any fever or chills.   Emesis      Home Medications Prior to Admission medications   Medication Sig Start Date End Date Taking? Authorizing Provider  omeprazole (PRILOSEC) 40 MG capsule Take 1 capsule (40 mg total) by mouth every morning. Patient taking differently: Take 40 mg by mouth 2 (two) times daily. 12/23/22   Esterwood, Amy S, PA-C  ondansetron (ZOFRAN-ODT) 4 MG disintegrating tablet Take 1 tablet (4 mg total) by mouth every 6 (six) hours as needed for nausea or vomiting. 12/23/22   Esterwood, Amy S, PA-C  promethazine (PHENERGAN) 25 MG tablet Take 1 tablet (25 mg total) by mouth every 6 (six) hours as needed for nausea or vomiting. 12/11/22   Gareth Eagle, PA-C      Allergies    Patient has no known allergies.    Review of Systems   Review of Systems  Gastrointestinal:  Positive for vomiting.    Physical Exam Updated Vital Signs BP 135/83   Pulse 60   Temp 98.4 F (36.9 C) (Oral)   Resp 16   SpO2 100%  Physical Exam Vitals and nursing note reviewed.  Constitutional:      General: He is not in acute distress.    Appearance: He is well-developed.     Comments: Not actively vomiting in the room, reports nausea  HENT:     Head: Normocephalic and atraumatic.  Eyes:     Conjunctiva/sclera: Conjunctivae normal.  Cardiovascular:     Rate and Rhythm:  Normal rate and regular rhythm.     Heart sounds: No murmur heard. Pulmonary:     Effort: Pulmonary effort is normal. No respiratory distress.     Breath sounds: Normal breath sounds.  Abdominal:     Palpations: Abdomen is soft.     Tenderness: There is no abdominal tenderness.  Musculoskeletal:        General: No swelling.     Cervical back: Neck supple.  Skin:    General: Skin is warm and dry.     Capillary Refill: Capillary refill takes less than 2 seconds.  Neurological:     Mental Status: He is alert.  Psychiatric:        Mood and Affect: Mood normal.     ED Results / Procedures / Treatments   Labs (all labs ordered are listed, but only abnormal results are displayed) Labs Reviewed  COMPREHENSIVE METABOLIC PANEL - Abnormal; Notable for the following components:      Result Value   CO2 21 (*)    Glucose, Bld 117 (*)    Total Protein 8.5 (*)    All other components within normal limits  LIPASE, BLOOD  CBC  URINALYSIS, ROUTINE W REFLEX MICROSCOPIC    EKG None  Radiology No results found.  Procedures Procedures    Medications Ordered in ED Medications  haloperidol lactate (HALDOL) injection 5 mg (5 mg Intravenous Given 06/21/23 0157)  ondansetron (ZOFRAN) injection 4 mg (4 mg Intravenous Given 06/21/23 0157)  lactated ringers bolus 1,000 mL (1,000 mLs Intravenous New Bag/Given 06/21/23 0158)  ketorolac (TORADOL) 30 MG/ML injection 30 mg (30 mg Intravenous Given 06/21/23 0156)    ED Course/ Medical Decision Making/ A&P                                 Medical Decision Making Amount and/or Complexity of Data Reviewed Labs: ordered.  Risk Prescription drug management.     Differential diagnosis includes but is not limited to Cannabinoid hyperemesis syndrome, Cholelithiasis, cholangitis, choledocholithiasis, peptic ulcer, gastritis, gastroenteritis, appendicitis, IBS, IBD, DKA, nephrolithiasis, UTI, pyelonephritis, pancreatitis, diverticulitis, mesenteric  ischemia, abdominal aortic aneurysm, small bowel obstruction, volvulus, testicular torsion in males, ovarian torsion and pregnancy related concerns in females of childbearing age    ED Course:  Patient overall well-appearing, stable vital signs.  He reports nausea and vomiting that started this morning.  This is also associated with some mild abdominal cramping when he vomits.  He is not actively vomiting in the room.  No abdominal tenderness to palpation on my exam.  He is requesting nausea medications and fluids. Patient given 5 mg Haldol and 4 mg Zofran for nausea.  Given Toradol for pain. I Ordered, and personally interpreted labs.  The pertinent results include:   CBC without abnormality CMP without any electrolyte abnormalities.  Creatinine and LFTs within normal limits Lipase within normal limits Upon re-evaluation, patient without any further vomiting.  Able to tolerate p.o. intake.  He is comfortable appearing with stable vital signs.  Given abdomen soft nontender, labs without any significant abnormality, low concern for acute abdominal pathology at this time.  No indication for further CT imaging.  Suspect his nausea vomiting may be secondary to cannabis use vs gastroenteritis.  Stable and appropriate for discharge home with some  Impression: Nausea and vomiting  Disposition:  The patient was discharged home with instructions to keep well-hydrated at home.  It appears he has been prescribed Zofran and Phenergan previously, may continue to use these medications as prescribed as needed for nausea and vomiting.  Follow-up with GI if he continues to have recurrent nausea and vomiting.  I have included their contact information in his discharge paperwork Return precautions given.    External records from outside source obtained and reviewed including previous ER notes where he has been seen many times for cyclical nausea and vomiting              Final Clinical  Impression(s) / ED Diagnoses Final diagnoses:  Nausea and vomiting, unspecified vomiting type    Rx / DC Orders ED Discharge Orders     None         Arabella Merles, PA-C 06/21/23 0405    Glendora Score, MD 06/21/23 [phone]

## 2023-06-21 NOTE — Discharge Instructions (Addendum)
 You have been seen here today for your nausea and vomiting.  Your labs overall look reassuring.  You do not have any electrolyte abnormalities today.  Your blood counts are normal.  Your kidney, pancreas, and liver function are normal.  You were given medications here today to help with your nausea and vomiting.  You were also given fluids here today.  You may continue to take your nausea medications at home as needed.  Please follow-up with gastroenterology if you continue to experience recurrent nausea and vomiting.  I have included their contact information below.  Return the ER for any unexplained fevers, severe abdominal pain, any other new or concerning symptoms.

## 2023-09-02 ENCOUNTER — Ambulatory Visit (INDEPENDENT_AMBULATORY_CARE_PROVIDER_SITE_OTHER): Admitting: Physician Assistant

## 2023-09-02 ENCOUNTER — Encounter: Payer: Self-pay | Admitting: Physician Assistant

## 2023-09-02 VITALS — BP 102/70 | HR 82 | Temp 98.5°F | Ht 73.0 in | Wt 160.0 lb

## 2023-09-02 DIAGNOSIS — R1115 Cyclical vomiting syndrome unrelated to migraine: Secondary | ICD-10-CM

## 2023-09-02 DIAGNOSIS — F1290 Cannabis use, unspecified, uncomplicated: Secondary | ICD-10-CM | POA: Diagnosis not present

## 2023-09-02 DIAGNOSIS — R630 Anorexia: Secondary | ICD-10-CM | POA: Insufficient documentation

## 2023-09-02 DIAGNOSIS — F17200 Nicotine dependence, unspecified, uncomplicated: Secondary | ICD-10-CM | POA: Diagnosis not present

## 2023-09-02 DIAGNOSIS — R739 Hyperglycemia, unspecified: Secondary | ICD-10-CM | POA: Insufficient documentation

## 2023-09-02 MED ORDER — ONDANSETRON 4 MG PO TBDP: 4.0000 mg | ORAL_TABLET | Freq: Four times a day (QID) | ORAL | 1 refills | Status: DC | PRN

## 2023-09-02 MED ORDER — MIRTAZAPINE 7.5 MG PO TABS: 7.5000 mg | ORAL_TABLET | Freq: Every day | ORAL | 1 refills | Status: AC

## 2023-09-02 MED ORDER — PROMETHAZINE HCL 25 MG PO TABS: 25.0000 mg | ORAL_TABLET | Freq: Four times a day (QID) | ORAL | 1 refills | Status: DC | PRN

## 2023-09-02 NOTE — Assessment & Plan Note (Signed)
 Reviewed with patient that nicotine/tobacco suppresses appetite.  May be a contributing factor here, could be unrelated, but encouraged cessation either way.  He is open to this.

## 2023-09-02 NOTE — Assessment & Plan Note (Signed)
 FMLA paperwork completed for patient.  However, had a frank conversation with him about discontinuing cannabis entirely, not because I am ready to blame all of his symptoms on cannabis use, but because frequent use muddies the clinical picture and can complicate proper diagnosis of the problem.  Patient in agreement and intends to completely stop cannabis use while undergoing workup for his GI issues.  Says he intends to have negative urine drug screen at his next visit, either with me or with GI.  He was forthcoming about his recent cannabis use, so we did not bother to check UDS today.  Differential certainly includes cannabinoid hyperemesis syndrome, but should be noted that he had evidence of gastritis and duodenitis on his EGD last year.  During our visit, I encouraged him to make a follow-up with his GI provider, and before leaving the office he had already called to make an appointment with them, scheduled for 09/29/2023.  Will refill his antiemetics for as needed use

## 2023-09-02 NOTE — Assessment & Plan Note (Signed)
 As stated above, his frequent cannabis use is complicating diagnosis of his GI issues, whether or not it is contributing to these issues.  Patient is in agreement with cessation of cannabis, at least while undergoing diagnostic evaluation.

## 2023-09-02 NOTE — Assessment & Plan Note (Signed)
 Will try to use low-dose mirtazapine at night to stimulate the appetite during the day.  This may also help with some of his depressive symptoms.

## 2023-09-02 NOTE — Patient Instructions (Signed)

## 2023-09-02 NOTE — Assessment & Plan Note (Signed)
 Persistent hyperglycemia, though mild, noted on all labs for the last year.  Family history of diabetes in mother warrants check of A1c today.

## 2023-09-02 NOTE — Progress Notes (Signed)
 Date:  09/02/2023   Name:  Reginald Choi   DOB:  Oct 23, 1985   MRN:  [id number]   Chief Complaint: Establish Care and Emesis (Pt wants FMLA paperwork filled out so when he has episodes he will be covered, started Saturday, went to work Sunday and got sent home was off Monday, manager told him to stay home Tuesday and Wednesday due to throwing up, this has happened in the past )  HPI Reginald Choi is a pleasant 38 y.o. male with history of cyclic vomiting and cannabis use disorder who presents to the clinic today to establish care.  He has been seen by urgent care and ED many times over the years, at least since 2018, for intermittent severe nausea and vomiting.  He frequently uses marijuana, reports estimated use of every other day, says this helps to stimulate his appetite and settle his stomach.  Without it, his appetite is next to nothing.  Prior providers have consistently pointed to marijuana use to explain his symptoms, which is certainly in the differential, but patient feels like marijuana is what he only thinks that helps him.  Last marijuana use was earlier today.  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  He was supposed to return to GI in November 2024, but no showed the appointment and never rescheduled.  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  He brings FLMA forms for my review.   Medication list has been reviewed and updated.  Current Meds  Medication Sig   mirtazapine (REMERON) 7.5 MG tablet Take 1 tablet (7.5 mg total) by mouth at bedtime.   [DISCONTINUED] omeprazole  (PRILOSEC) 40 MG capsule Take 1 capsule (40 mg total) by mouth every  morning. (Patient taking differently: Take 40 mg by mouth 2 (two) times daily.)   [DISCONTINUED] ondansetron  (ZOFRAN -ODT) 4 MG disintegrating tablet Take 1 tablet (4 mg total) by mouth every 6 (six) hours as needed for nausea or vomiting.   [DISCONTINUED] promethazine  (PHENERGAN ) 25 MG tablet Take 1 tablet (25 mg total) by mouth every 6 (six) hours as needed for nausea or vomiting.     Review of Systems  Patient Active Problem List   Diagnosis Date Noted   Poor appetite 09/02/2023   Hyperglycemia 09/02/2023   Tobacco use disorder 09/02/2023   Cannabis use disorder 12/24/2022   Cyclic vomiting syndrome 05/15/2018    No Known Allergies   There is no immunization history on file for this patient.  Past Surgical History:  Procedure Laterality Date   APPENDECTOMY     CIRCUMCISION     age 30    Social History   Tobacco Use   Smoking status: Every Day    Current packs/day: 0.50    Average packs/day: 0.5 packs/day for 12.4 years (6.2 ttl pk-yrs)    Types: Cigarettes    Start date: 2013   Smokeless tobacco: Never  Vaping Use   Vaping status: Never Used  Substance Use Topics   Alcohol use: Not Currently   Drug use: Yes    Types: Marijuana    Family History  Problem Relation Age of Onset   Diabetes Mother    Hypertension Mother  09/02/2023    4:00 PM  GAD 7 : Generalized Anxiety Score  Nervous, Anxious, on Edge 1  Control/stop worrying 0  Worry too much - different things 0  Trouble relaxing 2  Restless 1  Easily annoyed or irritable 0  Afraid - awful might happen 0  Total GAD 7 Score 4  Anxiety Difficulty Not difficult at all       09/02/2023    4:00 PM  Depression screen PHQ 2/9  Decreased Interest 3  Down, Depressed, Hopeless 1  PHQ - 2 Score 4  Altered sleeping 2  Tired, decreased energy 2  Change in appetite 3  Feeling bad or failure about yourself  0  Trouble concentrating 1  Moving slowly or fidgety/restless 0  Suicidal thoughts 0   PHQ-9 Score 12  Difficult doing work/chores Somewhat difficult    BP Readings from Last 3 Encounters:  09/02/23 102/70  06/21/23 122/71  12/27/22 113/75    Wt Readings from Last 3 Encounters:  09/02/23 160 lb (72.6 kg)  12/27/22 158 lb (71.7 kg)  12/23/22 158 lb 4 oz (71.8 kg)    BP 102/70   Pulse 82   Temp 98.5 F (36.9 C)   Ht 6\' 1"  (1.854 m)   Wt 160 lb (72.6 kg)   SpO2 95%   BMI 21.11 kg/m   Physical Exam Vitals and nursing note reviewed.  Constitutional:      Appearance: Normal appearance.  Cardiovascular:     Rate and Rhythm: Normal rate and regular rhythm.     Heart sounds: No murmur heard.    No friction rub. No gallop.  Pulmonary:     Effort: Pulmonary effort is normal.     Breath sounds: Normal breath sounds.  Abdominal:     General: Bowel sounds are increased. There is no distension.     Palpations: Abdomen is soft.     Tenderness: There is no abdominal tenderness.  Musculoskeletal:        General: Normal range of motion.  Skin:    General: Skin is warm and dry.  Neurological:     Mental Status: He is alert and oriented to person, place, and time.     Gait: Gait is intact.  Psychiatric:        Mood and Affect: Mood and affect normal.     Recent Labs     Component Value Date/Time   NA 136 06/20/2023 2141   K 3.7 06/20/2023 2141   CL 105 06/20/2023 2141   CO2 21 (L) 06/20/2023 2141   GLUCOSE 117 (H) 06/20/2023 2141   BUN 11 06/20/2023 2141   CREATININE 1.02 06/20/2023 2141   CALCIUM 9.6 06/20/2023 2141   PROT 8.5 (H) 06/20/2023 2141   ALBUMIN 4.8 06/20/2023 2141   AST 33 06/20/2023 2141   ALT 27 06/20/2023 2141   ALKPHOS 52 06/20/2023 2141   BILITOT 1.0 06/20/2023 2141   GFRNONAA >60 06/20/2023 2141    Lab Results  Component Value Date   WBC 8.2 06/20/2023   HGB 14.8 06/20/2023   HCT 42.8 06/20/2023   MCV 90.9 06/20/2023   PLT 213 06/20/2023   No results found for: "HGBA1C" No results found for: "CHOL", "HDL", "LDLCALC",  "LDLDIRECT", "TRIG", "CHOLHDL" No results found for: "TSH"   Assessment and Plan:  Cyclic vomiting syndrome Assessment & Plan: FMLA paperwork completed for patient.  However, had a frank conversation with him about discontinuing cannabis entirely, not because I am ready to blame all  of his symptoms on cannabis use, but because frequent use muddies the clinical picture and can complicate proper diagnosis of the problem.  Patient in agreement and intends to completely stop cannabis use while undergoing workup for his GI issues.  Says he intends to have negative urine drug screen at his next visit, either with me or with GI.  He was forthcoming about his recent cannabis use, so we did not bother to check UDS today.  Differential certainly includes cannabinoid hyperemesis syndrome, but should be noted that he had evidence of gastritis and duodenitis on his EGD last year.  During our visit, I encouraged him to make a follow-up with his GI provider, and before leaving the office he had already called to make an appointment with them, scheduled for 09/29/2023.  Will refill his antiemetics for as needed use  Orders: -     Ondansetron ; Take 1 tablet (4 mg total) by mouth every 6 (six) hours as needed for nausea or vomiting.  Dispense: 30 tablet; Refill: 1 -     Promethazine  HCl; Take 1 tablet (25 mg total) by mouth every 6 (six) hours as needed for nausea or vomiting.  Dispense: 30 tablet; Refill: 1  Cannabis use disorder Assessment & Plan: As stated above, his frequent cannabis use is complicating diagnosis of his GI issues, whether or not it is contributing to these issues.  Patient is in agreement with cessation of cannabis, at least while undergoing diagnostic evaluation.   Poor appetite Assessment & Plan: Will try to use low-dose mirtazapine at night to stimulate the appetite during the day.  This may also help with some of his depressive symptoms.  Orders: -     Mirtazapine; Take 1 tablet  (7.5 mg total) by mouth at bedtime.  Dispense: 30 tablet; Refill: 1  Tobacco use disorder Assessment & Plan: Reviewed with patient that nicotine/tobacco suppresses appetite.  May be a contributing factor here, could be unrelated, but encouraged cessation either way.  He is open to this.   Hyperglycemia Assessment & Plan: Persistent hyperglycemia, though mild, noted on all labs for the last year.  Family history of diabetes in mother warrants check of A1c today.  Orders: -     Hemoglobin A1c     Return in about 4 weeks (around 09/30/2023) for OV f/u chronic conditions.    Cody Das, PA-C, DMSc, Nutritionist [facility] [phone]

## 2023-09-03 LAB — HEMOGLOBIN A1C
Est. average glucose Bld gHb Est-mCnc: 103 mg/dL
Hgb A1c MFr Bld: 5.2 % (ref 4.8–5.6)

## 2023-09-05 ENCOUNTER — Ambulatory Visit: Payer: Self-pay | Admitting: Physician Assistant

## 2023-09-14 ENCOUNTER — Ambulatory Visit (HOSPITAL_COMMUNITY)
Admission: EM | Admit: 2023-09-14 | Discharge: 2023-09-14 | Disposition: A | Discharge status: Home / Self Care | Attending: Family Medicine | Admitting: Family Medicine

## 2023-09-14 ENCOUNTER — Encounter (HOSPITAL_COMMUNITY): Payer: Self-pay

## 2023-09-14 DIAGNOSIS — K0889 Other specified disorders of teeth and supporting structures: Secondary | ICD-10-CM | POA: Diagnosis not present

## 2023-09-14 MED ORDER — IBUPROFEN 800 MG PO TABS: 800.0000 mg | ORAL_TABLET | Freq: Three times a day (TID) | ORAL | 0 refills | Status: DC

## 2023-09-14 MED ORDER — AMOXICILLIN-POT CLAVULANATE 875-125 MG PO TABS: 1.0000 | ORAL_TABLET | Freq: Two times a day (BID) | ORAL | 0 refills | Status: AC

## 2023-09-14 NOTE — ED Triage Notes (Addendum)
 Pt states dental pain on the left and right bottom teeth.  States he has some broken teeth on both sides.

## 2023-09-14 NOTE — Discharge Instructions (Signed)
 Keep your dentist appointment next month.

## 2023-09-17 NOTE — ED Provider Notes (Signed)
 [facility]   [id number] 09/14/23 Arrival Time: 1524  ASSESSMENT & PLAN:  1. Pain, dental    No sign of abscess requiring I&D at this time. Discussed.  Meds ordered this encounter  Medications   amoxicillin -clavulanate (AUGMENTIN ) 875-125 MG tablet    Sig: Take 1 tablet by mouth every 12 (twelve) hours.    Dispense:  14 tablet    Refill:  0   ibuprofen  (ADVIL ) 800 MG tablet    Sig: Take 1 tablet (800 mg total) by mouth 3 (three) times daily with meals.    Dispense:  21 tablet    Refill:  0   He does have a dentist; reports upcoming appt.   Follow-up Information     Leopoldo Rancher, PA.   Specialty: Physician Assistant Why: As needed. Contact information: [address] [place] [address] [phone]                  Reviewed expectations re: course of current medical issues. Questions answered. Outlined signs and symptoms indicating need for more acute intervention. Patient verbalized understanding. After Visit Summary given.   SUBJECTIVE:  Reginald Choi is a 38 y.o. male who reports bilateral lower dental pain; gradual onset over past 10 days; 'aching'. Denies fever. H/O dental problems. No tx PTA. Tolerating PO intake.  Social History   Tobacco Use  Smoking Status Every Day   Current packs/day: 0.50   Average packs/day: 0.5 packs/day for 12.4 years (6.2 ttl pk-yrs)   Types: Cigarettes   Start date: 2013  Smokeless Tobacco Never     OBJECTIVE: Vitals:   09/14/23 1711  BP: (!) 101/59  Pulse: 69  Resp: 16  Temp: 98.3 F (36.8 C)  TempSrc: Oral  SpO2: 99%    General appearance: alert; no distress HENT: normocephalic; atraumatic; dentition: poor with multiple caries; is TTP over lower R gum mostly; no areas of fluctuance; no bleeding; normal jaw mevement Neck: supple without LAD; FROM; trachea midline Lungs: normal respirations; unlabored; speaks full sentences without difficulty Skin: warm and dry Psychological:  alert and cooperative; normal mood and affect  No Known Allergies  Past Medical History:  Diagnosis Date   No pertinent past medical history    Social History   Socioeconomic History   Marital status: Single    Spouse name: Not on file   Number of children: 3   Years of education: Not on file   Highest education level: Not on file  Occupational History   Occupation: Estate agent  Tobacco Use   Smoking status: Every Day    Current packs/day: 0.50    Average packs/day: 0.5 packs/day for 12.4 years (6.2 ttl pk-yrs)    Types: Cigarettes    Start date: 2013   Smokeless tobacco: Never  Vaping Use   Vaping status: Never Used  Substance and Sexual Activity   Alcohol use: Not Currently   Drug use: Yes    Types: Marijuana   Sexual activity: Yes  Other Topics Concern   Not on file  Social History Narrative   Not on file   Social Drivers of Health   Financial Resource Strain: Not on file  Food Insecurity: No Food Insecurity (09/02/2023)   Hunger Vital Sign    Worried About Running Out of Food in the Last Year: Never true    Ran Out of Food in the Last Year: Never true  Transportation Needs: No Transportation Needs (09/02/2023)   PRAPARE - Transportation  Lack of Transportation (Medical): No    Lack of Transportation (Non-Medical): No  Physical Activity: Not on file  Stress: Not on file  Social Connections: Not on file  Intimate Partner Violence: Not At Risk (09/02/2023)   Humiliation, Afraid, Rape, and Kick questionnaire    Fear of Current or Ex-Partner: No    Emotionally Abused: No    Physically Abused: No    Sexually Abused: No   Family History  Problem Relation Age of Onset   Diabetes Mother    Hypertension Mother    Past Surgical History:  Procedure Laterality Date   APPENDECTOMY     CIRCUMCISION     age 23      Afton Albright, MD 09/17/23 [phone]

## 2023-09-22 ENCOUNTER — Encounter (HOSPITAL_COMMUNITY): Payer: Self-pay | Admitting: *Deleted

## 2023-09-22 ENCOUNTER — Ambulatory Visit (HOSPITAL_COMMUNITY)
Admission: EM | Admit: 2023-09-22 | Discharge: 2023-09-22 | Disposition: A | Discharge status: Home / Self Care | Attending: Family Medicine | Admitting: Family Medicine

## 2023-09-22 ENCOUNTER — Ambulatory Visit: Payer: Self-pay

## 2023-09-22 DIAGNOSIS — K0889 Other specified disorders of teeth and supporting structures: Secondary | ICD-10-CM | POA: Diagnosis not present

## 2023-09-22 MED ORDER — IBUPROFEN 800 MG PO TABS: 800.0000 mg | ORAL_TABLET | Freq: Three times a day (TID) | ORAL | 0 refills | Status: AC

## 2023-09-22 MED ORDER — CHLORHEXIDINE GLUCONATE 0.12 % MT SOLN: 15.0000 mL | Freq: Two times a day (BID) | OROMUCOSAL | 0 refills | Status: AC

## 2023-09-22 NOTE — ED Triage Notes (Signed)
 Pt was seen 5/28 for same; states has dentist appt set in July, and is about to complete the abx, and has completely run out of the IBU Rx. Pt states he went to get a refill and was told by pharmacy he would need to get seen for another Rx.

## 2023-09-22 NOTE — Telephone Encounter (Signed)
 FYI Only or Action Required?: Action required by provider  Patient was last seen in primary care on 09/02/2023 by Leopoldo Rancher, PA. Called Nurse Triage reporting Dental Problem. Symptoms began several weeks ago. Interventions attempted: OTC medications: ineffective and Prescription medications: ran out. Symptoms are: gradually worsening.  Triage Disposition: See HCP Within 4 Hours (Or PCP Triage)  Patient/caregiver understands and will follow disposition?: No, wishes to speak with PCP Has dental appointment in July. Refuses office visit due to distance, not active on MyChart, states he is willing for phone visit or can send pictures.    Copied from CRM [id number]. Topic: Clinical - Medication Refill >> Sep 22, 2023  3:09 PM Phil Braun wrote: This medication was filled at urgent care for dental.   Medication:  ibuprofen  (ADVIL ) 800 MG tablet amoxicillin -clavulanate (AUGMENTIN ) 875-125 MG tablet  Has the patient contacted their pharmacy? No (Agent: If no, request that the patient contact the pharmacy for the refill. If patient does not wish to contact the pharmacy document the reason why and proceed with request.) (Agent: If yes, when and what did the pharmacy advise?)  This is the patient's preferred pharmacy:  [facility] [phone] Jonette Nestle, [place] - [address] [place] [address] Phone: [phone] Fax: [phone]  Is this the correct pharmacy for this prescription? Yes If no, delete pharmacy and type the correct one.   Has the prescription been filled recently? Yes  Is the patient out of the medication? Yes  Has the patient been seen for an appointment in the last year OR does the patient have an upcoming appointment? No  Can we respond through MyChart? No  Agent: Please be advised that Rx refills may take up to 3 business days. We ask that you follow-up with your pharmacy. Reason for Disposition  [1] SEVERE pain (e.g., excruciating, unable  to eat, unable to do any normal activities) AND [2] not improved 2 hours after pain medicine  Answer Assessment - Initial Assessment Questions 1. LOCATION: "Which tooth is hurting?"  (e.g., right-side/left-side, upper/lower, front/back)     Lower jaw pain-bilateral 2. ONSET: "When did the toothache start?"  (e.g., hours, days)      Few weeks 3. SEVERITY: "How bad is the toothache?"  (Scale 1-10; mild, moderate or severe)   - MILD (1-3): doesn't interfere with chewing    - MODERATE (4-7): interferes with chewing, interferes with normal activities, awakens from sleep     - SEVERE (8-10): unable to eat, unable to do any normal activities, excruciating pain        9/10 pain is constant, worse with cold application/air 4. SWELLING: "Is there any visible swelling of your face?"    denies 5. OTHER SYMPTOMS: "Do you have any other symptoms?" (e.g., fever)     Occasional headaches Evaluated at Urgent care on 09/14/23 prescribed augmentin  and ibuprophen, he completed course. Noted improvement in pain but still 9/10 and causing headaches, no increased swelling or new symptoms. Denies fever.  Next dental appointment is in July.  Protocols used: Toothache-A-AH

## 2023-09-22 NOTE — Telephone Encounter (Signed)
 Called pt let him know he would have to see a dentist. Pt stated he was calling in to get a refill of 800 MG ibuprofen  told pt dan is out of the office this week that he can get the medication OTC. Pt abruptly got off the phone.  KP

## 2023-09-22 NOTE — ED Provider Notes (Signed)
  [facility]   [id number] 09/22/23 Arrival Time: 1746  ASSESSMENT & PLAN:  1. Pain, dental    Has dental appt early July now. Will finish amox tomorrow. Ques nerve related dental pain; discussed.  Meds ordered this encounter  Medications   ibuprofen  (ADVIL ) 800 MG tablet    Sig: Take 1 tablet (800 mg total) by mouth 3 (three) times daily with meals.    Dispense:  21 tablet    Refill:  0   chlorhexidine (PERIDEX) 0.12 % solution    Sig: Use as directed 15 mLs in the mouth or throat 2 (two) times daily.    Dispense:  473 mL    Refill:  0   Declines work note.  Reviewed expectations re: course of current medical issues. Questions answered. Outlined signs and symptoms indicating need for more acute intervention. Patient verbalized understanding. After Visit Summary given.   SUBJECTIVE:  Reginald Choi is a 38 y.o. male who reports continued dental pain/soreness of gums. See last note. No change in history.   OBJECTIVE: Vitals:   09/22/23 1758 09/22/23 1759  BP:  118/76  Pulse: 96   Resp: 16   Temp: 98 F (36.7 C)   TempSrc: Oral   SpO2: 94%     General appearance: alert; no distress  No Known Allergies  Past Medical History:  Diagnosis Date   No pertinent past medical history    Social History   Socioeconomic History   Marital status: Single    Spouse name: Not on file   Number of children: 3   Years of education: Not on file   Highest education level: Not on file  Occupational History   Occupation: Estate agent  Tobacco Use   Smoking status: Every Day    Current packs/day: 0.50    Average packs/day: 0.5 packs/day for 12.4 years (6.2 ttl pk-yrs)    Types: Cigarettes    Start date: 2013   Smokeless tobacco: Never  Vaping Use   Vaping status: Never Used  Substance and Sexual Activity   Alcohol use: Not Currently   Drug use: Not Currently    Types: Marijuana   Sexual activity: Not on file  Other Topics Concern   Not on file  Social  History Narrative   Not on file   Social Drivers of Health   Financial Resource Strain: Not on file  Food Insecurity: No Food Insecurity (09/02/2023)   Hunger Vital Sign    Worried About Running Out of Food in the Last Year: Never true    Ran Out of Food in the Last Year: Never true  Transportation Needs: No Transportation Needs (09/02/2023)   PRAPARE - Administrator, Civil Service (Medical): No    Lack of Transportation (Non-Medical): No  Physical Activity: Not on file  Stress: Not on file  Social Connections: Not on file  Intimate Partner Violence: Not At Risk (09/02/2023)   Humiliation, Afraid, Rape, and Kick questionnaire    Fear of Current or Ex-Partner: No    Emotionally Abused: No    Physically Abused: No    Sexually Abused: No   Family History  Problem Relation Age of Onset   Diabetes Mother    Hypertension Mother    Past Surgical History:  Procedure Laterality Date   APPENDECTOMY     CIRCUMCISION     age 29      Afton Albright, MD 09/22/23 [phone]

## 2023-09-29 ENCOUNTER — Ambulatory Visit: Admitting: Gastroenterology

## 2023-09-29 NOTE — Progress Notes (Deleted)
 Chief Complaint:*** Primary GI Doctor:Dr. Rosaline Coma  HPI: Reginald Choi is a 38 year old African-American male, referred after recent ER visit on 12/20/2022 with complaints of recurrent nausea vomiting and epigastric pain. Last seen in the office by Amy, PA on 12/23/2022. Patient had EGD with Dr. Rosaline Coma on 12/27/22. He has had several ER visits with above symptoms.  He did have CT of the abdomen and pelvis done on 12/11/2022 which was unremarkable other than prior appendectomy.   12/27/2022 EGD with Dr. Rosaline Coma-- Normal esophagus. - Gastritis. Biopsied. - Duodenitis. Biopsied. Path: 1. Surgical [P], duodenal :       DUODENAL MUCOSA WITH FOVEOLAR METAPLASIA AND BRUNNER GLAND HYPERPLASIA       CONSISTENT WITH CHRONIC PEPTIC DUODENITIS.       NEGATIVE FOR DYSPLASIA.        2. Surgical [P], gastric :       GASTRIC ANTRAL MUCOSA WITH REACTIVE GASTROPATHY.       GASTRIC OXYNTIC MUCOSA WITHOUT SIGNIFICANT DIAGNOSTIC ALTERATION.       NO H. PYLORI IDENTIFIED ON H&E.      NEGATIVE FOR INTESTINAL METAPLASIA OR DYSPLASIA.   Patient seen on 06/20/23 in ED for nausea and vomiting. Treated with IV fluids and antiemetics. Labs- normal CBC, glucose >100, normal lipase      Plan; Discussion today with the patient regarding the correlation between chronic cannabis use and recurring nausea and vomiting. Will send prescription for Zofran  and ODT 4 mg every 6 hours as needed.  I have asked him to take this at first onset of nausea and then continue taking regular doses ***     Interval History  Patient admits/denies GERD Use Prilosec ( omeprazole ) 40 mg PO BID for 8 weeks, then decrease to daily. Patient admits/denies dysphagia Patient admits/denies nausea, vomiting, or weight loss  On augmentin  for tooth infection,   Patient admits/denies altered bowel habits Patient admits/denies abdominal pain Patient admits/denies rectal bleeding   Denies/Admits alcohol Denies/Admits smoking Denies/Admits NSAID  use. Denies/Admits they are on blood thinners.  Patients last colonoscopy Patients last EGD  Patient's family history includes  Wt Readings from Last 3 Encounters:  09/02/23 160 lb (72.6 kg)  12/27/22 158 lb (71.7 kg)  12/23/22 158 lb 4 oz (71.8 kg)      Past Medical History:  Diagnosis Date   No pertinent past medical history     Past Surgical History:  Procedure Laterality Date   APPENDECTOMY     CIRCUMCISION     age 51    Current Outpatient Medications  Medication Sig Dispense Refill   amoxicillin -clavulanate (AUGMENTIN ) 875-125 MG tablet Take 1 tablet by mouth every 12 (twelve) hours. 14 tablet 0   chlorhexidine  (PERIDEX ) 0.12 % solution Use as directed 15 mLs in the mouth or throat 2 (two) times daily. 473 mL 0   ibuprofen  (ADVIL ) 800 MG tablet Take 1 tablet (800 mg total) by mouth 3 (three) times daily with meals. 21 tablet 0   mirtazapine  (REMERON ) 7.5 MG tablet Take 1 tablet (7.5 mg total) by mouth at bedtime. 30 tablet 1   No current facility-administered medications for this visit.    Allergies as of 09/29/2023   (No Known Allergies)    Family History  Problem Relation Age of Onset   Diabetes Mother    Hypertension Mother     Review of Systems:    Constitutional: No weight loss, fever, chills, weakness or fatigue HEENT: Eyes: No change in vision  Ears, Nose, Throat:  No change in hearing or congestion Skin: No rash or itching Cardiovascular: No chest pain, chest pressure or palpitations   Respiratory: No SOB or cough Gastrointestinal: See HPI and otherwise negative Genitourinary: No dysuria or change in urinary frequency Neurological: No headache, dizziness or syncope Musculoskeletal: No new muscle or joint pain Hematologic: No bleeding or bruising Psychiatric: No history of depression or anxiety    Physical Exam:  Vital signs: There were no vitals taken for this visit.  Constitutional:   Pleasant *** male appears to be in  NAD, Well developed, Well nourished, alert and cooperative Head:  Normocephalic and atraumatic. Eyes:   PEERL, EOMI. No icterus. Conjunctiva pink. Ears:  Normal auditory acuity. Neck:  Supple Throat: Oral cavity and pharynx without inflammation, swelling or lesion.  Respiratory: Respirations even and unlabored. Lungs clear to auscultation bilaterally.   No wheezes, crackles, or rhonchi.  Cardiovascular: Normal S1, S2. Regular rate and rhythm. No peripheral edema, cyanosis or pallor.  Gastrointestinal:  Soft, nondistended, nontender. No rebound or guarding. Normal bowel sounds. No appreciable masses or hepatomegaly. Rectal:  Not performed.  Anoscopy: Msk:  Symmetrical without gross deformities. Without edema, no deformity or joint abnormality.  Neurologic:  Alert and  oriented x4;  grossly normal neurologically.  Skin:   Dry and intact without significant lesions or rashes. Psychiatric: Oriented to person, place and time. Demonstrates good judgement and reason without abnormal affect or behaviors.  RELEVANT LABS AND IMAGING: CBC    Latest Ref Rng & Units 06/20/2023    9:41 PM 12/20/2022    3:45 PM 12/11/2022    9:53 AM  CBC  WBC 4.0 - 10.5 K/uL 8.2  7.4  6.5   Hemoglobin 13.0 - 17.0 g/dL 40.1  02.7  25.3   Hematocrit 39.0 - 52.0 % 42.8  44.2  42.8   Platelets 150 - 400 K/uL 213  241  213      CMP     Latest Ref Rng & Units 06/20/2023    9:41 PM 12/20/2022    3:45 PM 12/11/2022    9:53 AM  CMP  Glucose 70 - 99 mg/dL 664  403  474   BUN 6 - 20 mg/dL 11  11  10    Creatinine 0.61 - 1.24 mg/dL 2.59  5.63  8.75   Sodium 135 - 145 mmol/L 136  139  136   Potassium 3.5 - 5.1 mmol/L 3.7  3.9  3.6   Chloride 98 - 111 mmol/L 105  103  103   CO2 22 - 32 mmol/L 21  23  22    Calcium 8.9 - 10.3 mg/dL 9.6  9.6  8.8   Total Protein 6.5 - 8.1 g/dL 8.5  8.1  7.4   Total Bilirubin 0.0 - 1.2 mg/dL 1.0  0.8  0.9   Alkaline Phos 38 - 126 U/L 52  41  41   AST 15 - 41 U/L 33  22  41   ALT 0 - 44 U/L 27   19  41    09/02/23 Ha1c 5.2   Assessment: 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  Plan: Encourage marijuana cessation.   Thank you for the courtesy of this consult. Please call me with any questions or concerns.   Sinaya Minogue, FNP-C [facility] 09/29/2023, 5:43 AM  Cc: Leopoldo Rancher, PA

## 2023-09-30 ENCOUNTER — Ambulatory Visit: Admitting: Physician Assistant

## 2024-02-27 ENCOUNTER — Other Ambulatory Visit: Payer: Self-pay

## 2024-02-27 ENCOUNTER — Emergency Department (HOSPITAL_COMMUNITY)
Admission: EM | Admit: 2024-02-27 | Discharge: 2024-02-28 | Disposition: A | Attending: Emergency Medicine | Admitting: Emergency Medicine

## 2024-02-27 ENCOUNTER — Encounter (HOSPITAL_COMMUNITY): Payer: Self-pay

## 2024-02-27 DIAGNOSIS — K0889 Other specified disorders of teeth and supporting structures: Secondary | ICD-10-CM | POA: Diagnosis present

## 2024-02-27 DIAGNOSIS — K047 Periapical abscess without sinus: Secondary | ICD-10-CM | POA: Diagnosis not present

## 2024-02-27 NOTE — ED Triage Notes (Signed)
 Pt has a abscess in his mouth that he noticed around two days ago, it is a lower left abscess.

## 2024-02-28 MED ORDER — PENICILLIN V POTASSIUM 500 MG PO TABS: 500.0000 mg | ORAL_TABLET | Freq: Four times a day (QID) | ORAL | 0 refills | Status: AC

## 2024-02-28 MED ORDER — ACETAMINOPHEN 325 MG PO TABS
650.0000 mg | ORAL_TABLET | Freq: Once | ORAL | Status: AC
  Administered 2024-02-28: 650 mg via ORAL
  Filled 2024-02-28: qty 2

## 2024-02-28 MED ORDER — NAPROXEN 250 MG PO TABS
500.0000 mg | ORAL_TABLET | Freq: Once | ORAL | Status: AC
  Administered 2024-02-28: 500 mg via ORAL
  Filled 2024-02-28: qty 2

## 2024-02-28 MED ORDER — PENICILLIN V POTASSIUM 250 MG PO TABS
500.0000 mg | ORAL_TABLET | Freq: Once | ORAL | Status: AC
  Administered 2024-02-28: 500 mg via ORAL
  Filled 2024-02-28: qty 2

## 2024-02-28 NOTE — Discharge Instructions (Signed)
 Go to your dentist as scheduled.  Take antibiotics as prescribed and complete the full course. Take 600mg  Advil  Liquid Gel with 650mg  Tylenol every 6 hours for pain. Rinse with Listerine after every meal.

## 2024-02-28 NOTE — ED Provider Notes (Signed)
 [place] EMERGENCY DEPARTMENT AT [facility] Provider Note   CSN: [id number] Arrival date & time: 02/27/24  2337     Patient presents with: Abscess   Reginald Choi is a 38 y.o. male.   38 yo male with bottom left dental pain, onset a while ago but worse tonight. No fever, not trauma, no drainage.        Prior to Admission medications   Medication Sig Start Date End Date Taking? Authorizing Provider  penicillin v potassium (VEETID) 500 MG tablet Take 1 tablet (500 mg total) by mouth 4 (four) times daily for 7 days. 02/28/24 03/06/24 Yes Beverley Leita LABOR, PA-C  amoxicillin -clavulanate (AUGMENTIN ) 875-125 MG tablet Take 1 tablet by mouth every 12 (twelve) hours. 09/14/23   Rolinda Rogue, MD  chlorhexidine  (PERIDEX ) 0.12 % solution Use as directed 15 mLs in the mouth or throat 2 (two) times daily. 09/22/23   Rolinda Rogue, MD  ibuprofen  (ADVIL ) 800 MG tablet Take 1 tablet (800 mg total) by mouth 3 (three) times daily with meals. 09/22/23   Rolinda Rogue, MD  mirtazapine  (REMERON ) 7.5 MG tablet Take 1 tablet (7.5 mg total) by mouth at bedtime. 09/02/23   Manya Toribio SQUIBB, PA    Allergies: Patient has no known allergies.    Review of Systems Negative except as per HPI Updated Vital Signs BP 137/81   Pulse 69   Temp 98.4 F (36.9 C)   Resp 16   SpO2 100%   Physical Exam Vitals and nursing note reviewed.  Constitutional:      General: He is not in acute distress.    Appearance: He is well-developed. He is not diaphoretic.  HENT:     Head: Normocephalic and atraumatic.     Jaw: No trismus or pain on movement.     Mouth/Throat:     Dentition: Dental tenderness, gingival swelling and dental abscesses present.     Tongue: No lesions. Tongue does not deviate from midline.     Pharynx: Oropharynx is clear. Uvula midline.   Pulmonary:     Effort: Pulmonary effort is normal.  Neurological:     Mental Status: He is alert and oriented to person, place, and time.   Psychiatric:        Behavior: Behavior normal.     (all labs ordered are listed, but only abnormal results are displayed) Labs Reviewed - No data to display  EKG: None  Radiology: No results found.   Procedures   Medications Ordered in the ED  penicillin v potassium (VEETID) tablet 500 mg (500 mg Oral Given 02/28/24 0045)  naproxen (NAPROSYN) tablet 500 mg (500 mg Oral Given 02/28/24 0045)  acetaminophen (TYLENOL) tablet 650 mg (650 mg Oral Given 02/28/24 0045)                                    Medical Decision Making Risk OTC drugs. Prescription drug management.   38 year old male with left lower dental pain, likely abscess.  Patient plans to follow-up with dentist in the morning.  Provided with antibiotics.     Final diagnoses:  Dental abscess    ED Discharge Orders          Ordered    penicillin v potassium (VEETID) 500 MG tablet  4 times daily        02/28/24 0036  Beverley Leita LABOR, PA-C 02/28/24 9692    Melvenia Motto, MD 02/28/24 0530

## 2024-03-01 ENCOUNTER — Telehealth: Payer: Self-pay

## 2024-03-01 NOTE — Transitions of Care (Post Inpatient/ED Visit) (Signed)
   03/01/2024  Name: Reginald Choi MRN: [id number] DOB: December 28, 1985  Today's TOC FU Call Status: Today's TOC FU Call Status:: Successful TOC FU Call Completed TOC FU Call Complete Date: 03/01/24  Patient's Name and Date of Birth confirmed.    Transition Care Management Follow-up Telephone Call Date of Discharge: 02/28/24 Discharge Facility: [facility]) Type of Discharge: Emergency Department Reason for ED Visit: Other: (Periapical abscess without sinus) How have you been since you were released from the hospital?: Better Any questions or concerns?: Yes Patient Questions/Concerns:: mouth still hurts  Items Reviewed: Did you receive and understand the discharge instructions provided?: Yes Medications obtained,verified, and reconciled?: Yes (Medications Reviewed) Any new allergies since your discharge?: No Dietary orders reviewed?: NA Do you have support at home?: Yes  Medications Reviewed Today: Medications Reviewed Today     Reviewed by Nanette Wirsing, Steva SAILOR, CMA (Certified Medical Assistant) on 03/01/24 at [id number]  Med List Status: <None>   Medication Order Taking? Sig Documenting Provider Last Dose Status Informant  amoxicillin -clavulanate (AUGMENTIN ) 875-125 MG tablet [id number]  Take 1 tablet by mouth every 12 (twelve) hours. Rolinda Rogue, MD  Active   chlorhexidine  (PERIDEX ) 0.12 % solution [id number]  Use as directed 15 mLs in the mouth or throat 2 (two) times daily. Rolinda Rogue, MD  Active   ibuprofen  (ADVIL ) 800 MG tablet [id number]  Take 1 tablet (800 mg total) by mouth 3 (three) times daily with meals. Rolinda Rogue, MD  Active   mirtazapine  (REMERON ) 7.5 MG tablet [id number]  Take 1 tablet (7.5 mg total) by mouth at bedtime. Manya Toribio SQUIBB, PA  Active   penicillin v potassium (VEETID) 500 MG tablet [id number]  Take 1 tablet (500 mg total) by mouth 4 (four) times daily for 7 days. Beverley Leita LABOR, PA-C  Active             Home Care and Equipment/Supplies: Were Home  Health Services Ordered?: NA Any new equipment or medical supplies ordered?: NA  Functional Questionnaire: Do you need assistance with bathing/showering or dressing?: No Do you need assistance with meal preparation?: No Do you need assistance with eating?: No Do you have difficulty maintaining continence: No Do you need assistance with getting out of bed/getting out of a chair/moving?: No Do you have difficulty managing or taking your medications?: No  Follow up appointments reviewed: PCP Follow-up appointment confirmed?: NA (pt stated he has no insurance that is does not start until dec. Pt stated he has a appt with a dentist in Dec. told pt he should try to call to be seen sooner) Specialist Hospital Follow-up appointment confirmed?: NA Do you need transportation to your follow-up appointment?: No Do you understand care options if your condition(s) worsen?: Yes-patient verbalized understanding    SIGNATURE Steva Calea Hribar, CMA
